# Patient Record
Sex: Male | Born: 1980 | Race: White | Hispanic: No | Marital: Married | Smoking: Current every day smoker
Health system: Southern US, Community
[De-identification: ages and names within clinical notes are randomized; demographics above are authoritative.]

## PROBLEM LIST (undated history)

## (undated) DIAGNOSIS — L02419 Cutaneous abscess of limb, unspecified: Secondary | ICD-10-CM

## (undated) DIAGNOSIS — B1920 Unspecified viral hepatitis C without hepatic coma: Secondary | ICD-10-CM

## (undated) HISTORY — PX: TONSILLECTOMY: SUR1361

---

## 2017-05-23 ENCOUNTER — Emergency Department (HOSPITAL_COMMUNITY)
Admission: EM | Admit: 2017-05-23 | Discharge: 2017-05-23 | Disposition: A | Discharge status: Home / Self Care | Payer: Self-pay | Attending: Emergency Medicine | Admitting: Emergency Medicine

## 2017-05-23 ENCOUNTER — Encounter (HOSPITAL_COMMUNITY): Payer: Self-pay

## 2017-05-23 ENCOUNTER — Other Ambulatory Visit: Payer: Self-pay

## 2017-05-23 ENCOUNTER — Emergency Department (HOSPITAL_COMMUNITY): Payer: Self-pay

## 2017-05-23 DIAGNOSIS — F17210 Nicotine dependence, cigarettes, uncomplicated: Secondary | ICD-10-CM | POA: Insufficient documentation

## 2017-05-23 DIAGNOSIS — L03113 Cellulitis of right upper limb: Secondary | ICD-10-CM | POA: Insufficient documentation

## 2017-05-23 HISTORY — DX: Unspecified viral hepatitis C without hepatic coma: B19.20

## 2017-05-23 LAB — BASIC METABOLIC PANEL
Anion gap: 8 (ref 5–15)
BUN: 15 mg/dL (ref 6–20)
CO2: 24 mmol/L (ref 22–32)
CREATININE: 0.8 mg/dL (ref 0.61–1.24)
Calcium: 8.8 mg/dL — ABNORMAL LOW (ref 8.9–10.3)
Chloride: 102 mmol/L (ref 101–111)
GFR calc non Af Amer: >60 mL/min (ref >60)
Glucose, Bld: 105 mg/dL — ABNORMAL HIGH (ref 65–99)
Potassium: 4.2 mmol/L (ref 3.5–5.1)
Sodium: 134 mmol/L — ABNORMAL LOW (ref 135–145)

## 2017-05-23 LAB — CBC WITH DIFFERENTIAL/PLATELET
BASOS PCT: 0 %
Basophils Absolute: 0 10*3/uL (ref 0.0–0.1)
EOS ABS: 0.3 10*3/uL (ref 0.0–0.7)
Eosinophils Relative: 2 %
HEMATOCRIT: 45.6 % (ref 39.0–52.0)
Hemoglobin: 15.7 g/dL (ref 13.0–17.0)
Lymphocytes Relative: 17 %
Lymphs Abs: 2.6 10*3/uL (ref 0.7–4.0)
MCH: 29.6 pg (ref 26.0–34.0)
MCHC: 34.4 g/dL (ref 30.0–36.0)
MCV: 86 fL (ref 78.0–100.0)
MONO ABS: 1 10*3/uL (ref 0.1–1.0)
MONOS PCT: 6 %
NEUTROS ABS: 11.5 10*3/uL — AB (ref 1.7–7.7)
Neutrophils Relative %: 75 %
Platelets: 251 10*3/uL (ref 150–400)
RBC: 5.3 MIL/uL (ref 4.22–5.81)
RDW: 14.5 % (ref 11.5–15.5)
WBC: 15.4 10*3/uL — ABNORMAL HIGH (ref 4.0–10.5)

## 2017-05-23 LAB — I-STAT CG4 LACTIC ACID, ED: Lactic Acid, Venous: 0.9 mmol/L (ref 0.5–1.9)

## 2017-05-23 MED ORDER — CEFTRIAXONE SODIUM 2 G IJ SOLR: 2.0000 g | Freq: Once | INTRAMUSCULAR | Status: DC

## 2017-05-23 MED ORDER — VANCOMYCIN HCL IN DEXTROSE 1-5 GM/200ML-% IV SOLN
1000.0000 mg | Freq: Once | INTRAVENOUS | Status: AC
  Administered 2017-05-23: 1000 mg via INTRAVENOUS
  Filled 2017-05-23: qty 200

## 2017-05-23 MED ORDER — CLINDAMYCIN HCL 150 MG PO CAPS: 450.0000 mg | ORAL_CAPSULE | Freq: Four times a day (QID) | ORAL | 0 refills | Status: AC

## 2017-05-23 MED ORDER — STERILE WATER FOR INJECTION IJ SOLN
INTRAMUSCULAR | Status: AC
  Administered 2017-05-23: 2.1 mL
  Filled 2017-05-23: qty 10

## 2017-05-23 MED ORDER — CEFTRIAXONE SODIUM 1 G IJ SOLR
1.0000 g | Freq: Once | INTRAMUSCULAR | Status: AC
  Administered 2017-05-23: 1 g via INTRAMUSCULAR
  Filled 2017-05-23: qty 10

## 2017-05-23 NOTE — ED Triage Notes (Signed)
Per Pt, Pt is coming from home with complaints of abscess noted to the right inner arm. Hx of the same x2. No redness at the site noted.

## 2017-05-23 NOTE — Discharge Instructions (Signed)
You presented with swelling, warmth and pain to right elbow. We are concerned for a deeper space abscess or infection that may be involving the joint. You declined further workup, CT scan and admission today and requested discharge. Please take antibiotics as prescribed. Take ibuprofen and Tylenol for pain and swelling. Return to the ED if you develop fevers, chills, worsening symptoms, inability to move the arm.

## 2017-05-23 NOTE — ED Provider Notes (Signed)
[facility]   CSN: [id number] Arrival date & time: 05/23/17  1244     History   Chief Complaint Chief Complaint  Patient presents with  . Abscess    HPI Brett [name] is a 36 y.o. male with history of IV drug use, multiple abscesses, osteomyelitis presents to ED for evaluation of gradually worsening swelling, redness, warmth and tenderness to right elbow 3 days. States he recently relapsed and injected cocaine through his right arm 3 days ago. Has been taking Excedrin and penicillin at home. Unsure about fevers but reports chills. Pain is worse with outpatient and movement. States he cannot extend his arm due to pain. No numbness or tingling distally. States this feels like his previous abscess but pain is worse and swelling happened faster.  HPI  Past Medical History:  Diagnosis Date  . Hepatitis C     There are no active problems to display for this patient.   Past Surgical History:  Procedure Laterality Date  . TONSILLECTOMY         Home Medications    Prior to Admission medications   Medication Sig Start Date End Date Taking? Authorizing Provider  clindamycin (CLEOCIN) 150 MG capsule Take 3 capsules (450 mg total) by mouth every 6 (six) hours for 7 days. 05/23/17 05/30/17  Liberty HandyGibbons, Claudia J, PA-C    Family History No family history on file.  Social History Social History   Tobacco Use  . Smoking status: Current Every Day Smoker    Packs/day: 0.50    Types: Cigarettes  . Smokeless tobacco: Never Used  Substance Use Topics  . Alcohol use: No    Frequency: Never  . Drug use: Yes    Types: Cocaine, Marijuana     Allergies   Patient has no known allergies.   Review of Systems Review of Systems  Constitutional: Positive for chills.  Musculoskeletal: Positive for arthralgias.  Skin: Positive for color change.     Physical Exam Updated Vital Signs BP 127/77 (BP Location: Left Arm)   Pulse  78   Temp 98.2 F (36.8 C) (Oral)   Resp 18   Ht 6\' 1"  (1.854 m)   Wt 93 kg (205 lb)   SpO2 92%   BMI 27.05 kg/m   Physical Exam  Constitutional: He is oriented to person, place, and time. He appears well-developed and well-nourished. No distress.  NAD. Non toxic.   HENT:  Head: Normocephalic and atraumatic.  Right Ear: External ear normal.  Left Ear: External ear normal.  Nose: Nose normal.  Eyes: Conjunctivae and EOM are normal. No scleral icterus.  Neck: Normal range of motion. Neck supple.  Cardiovascular: Normal rate, regular rhythm, normal heart sounds and intact distal pulses.  No murmur heard. Pulmonary/Chest: Effort normal and breath sounds normal. He has no wheezes.  Musculoskeletal: Normal range of motion. He exhibits edema and tenderness. He exhibits no deformity.  Focal area of induration, erythema, edema and warmth to left medial elbow measuring approximately 5 cm x 6 cm Mild pain with passive range of motion, decreased extension secondary to pain  Neurological: He is alert and oriented to person, place, and time.  Skin: Skin is warm and dry. Capillary refill takes less than 2 seconds.  See above. Multiple scars to bilateral upper extremities  Psychiatric: He has a normal mood and affect. His behavior is normal. Judgment and thought content normal.  Nursing note and vitals reviewed.    ED Treatments /  Results  Labs (all labs ordered are listed, but only abnormal results are displayed) Labs Reviewed  CBC WITH DIFFERENTIAL/PLATELET - Abnormal; Notable for the following components:      Result Value   WBC 15.4 (*)    Neutro Abs 11.5 (*)    All other components within normal limits  BASIC METABOLIC PANEL - Abnormal; Notable for the following components:   Sodium 134 (*)    Glucose, Bld 105 (*)    Calcium 8.8 (*)    All other components within normal limits  CULTURE, BLOOD (ROUTINE X 2)  CULTURE, BLOOD (ROUTINE X 2)  I-STAT CG4 LACTIC ACID, ED  I-STAT CG4  LACTIC ACID, ED    EKG  EKG Interpretation None       Radiology Dg Elbow Complete Right  Result Date: 05/23/2017 CLINICAL DATA:  Right arm pain, swelling, and warmth with concern for abscess. History of IV drug use. EXAM: RIGHT ELBOW - COMPLETE 3+ VIEW COMPARISON:  None. FINDINGS: There is no evidence of acute fracture, dislocation, or elbow joint effusion. Subcutaneous fat reticulation is present at the level of the antecubital fossa extending into the upper arm and forearm. No radiopaque foreign body is seen. IMPRESSION: Cellulitic changes without evidence of acute osseous abnormality or radiopaque foreign body. Electronically Signed   By: Sebastian AcheAllen  Grady M.D.   On: 05/23/2017 14:10    Procedures Procedures (including critical care time)  Medications Ordered in ED Medications  cefTRIAXone (ROCEPHIN) 2 g in dextrose 5 % 50 mL IVPB (not administered)  cefTRIAXone (ROCEPHIN) injection 1 g (not administered)  vancomycin (VANCOCIN) IVPB 1000 mg/200 mL premix (0 mg Intravenous Stopped 05/23/17 1715)     Initial Impression / Assessment and Plan / ED Course  I have reviewed the triage vital signs and the nursing notes.  Pertinent labs & imaging results that were available during my care of the patient were reviewed by me and considered in my medical decision making (see chart for details).  Clinical Course as of May 24 1735  Sun May 23, 2017  1622 WBC: (!) 15.4 [CG]  1625 Asked RN regarding blood cultures. These were not obtained prior to IV antibiotics. More blood needs to be drawn.   [CG]    Clinical Course User Index [CG] Liberty HandyGibbons, Claudia J, PA-C   36 year old with history of IV drug use presents with focal area of induration to the right elbow 3 days. He injected into this area 3-4 days ago.  On exam, he is afebrile. He has moderate are of induration to elbow with mild pain with passive range of motion. Extremities neurovascularly intact. No obvious fluctuance or superficial  abscess seen on ultrasound.  Labwork remarkable for leukocytosis WBC is 15.4. Lactic acid is normal. X-ray shows cellulitic changes from medial elbow extending to upper and lower arm. Will give antibiotics and obtain CT scan.  Final Clinical Impressions(s) / ED Diagnoses   IV infiltrated mid antibiotics. Awaiting IV team. Pending abx and CT scan. Given high risk, previous h/o, subjective chills I anticipate admission for iv antibiotics.   1730: Pt declining admission, CT or any other tx today. States he is worried about money and has to go to work [employer]. Discussed risks of leaving AMA. He requested d/c. Will give CTX IM and discharge with abx. Strict ED return precautions given.   Final diagnoses:  Cellulitis of right upper extremity    ED Discharge Orders        Ordered    clindamycin (CLEOCIN)  150 MG capsule  Every 6 hours     05/23/17 1733        Jerrell Mylar 05/23/17 1736    Loren Racer, MD 05/24/17 [phone]

## 2018-01-10 ENCOUNTER — Emergency Department (HOSPITAL_COMMUNITY): Admission: EM | Admit: 2018-01-10 | Discharge: 2018-01-10 | Discharge status: Against Medical Advice | Payer: Self-pay

## 2018-01-10 ENCOUNTER — Encounter (HOSPITAL_COMMUNITY): Payer: Self-pay | Admitting: *Deleted

## 2018-01-10 ENCOUNTER — Emergency Department (HOSPITAL_COMMUNITY)
Admission: EM | Admit: 2018-01-10 | Discharge: 2018-01-10 | Disposition: A | Discharge status: Home / Self Care | Payer: Self-pay | Attending: Emergency Medicine | Admitting: Emergency Medicine

## 2018-01-10 ENCOUNTER — Other Ambulatory Visit: Payer: Self-pay

## 2018-01-10 DIAGNOSIS — Z5321 Procedure and treatment not carried out due to patient leaving prior to being seen by health care provider: Secondary | ICD-10-CM | POA: Insufficient documentation

## 2018-01-10 DIAGNOSIS — K0889 Other specified disorders of teeth and supporting structures: Secondary | ICD-10-CM | POA: Insufficient documentation

## 2018-01-10 DIAGNOSIS — R339 Retention of urine, unspecified: Secondary | ICD-10-CM | POA: Insufficient documentation

## 2018-01-10 LAB — URINALYSIS, ROUTINE W REFLEX MICROSCOPIC
Bilirubin Urine: NEGATIVE
GLUCOSE, UA: NEGATIVE mg/dL
HGB URINE DIPSTICK: NEGATIVE
Ketones, ur: NEGATIVE mg/dL
LEUKOCYTES UA: NEGATIVE
Nitrite: NEGATIVE
PROTEIN: NEGATIVE mg/dL
SPECIFIC GRAVITY, URINE: 1.025 (ref 1.005–1.030)
pH: 5 (ref 5.0–8.0)

## 2018-01-10 NOTE — ED Notes (Signed)
Per front desk lobby staff, the pt went to [facility]

## 2018-01-10 NOTE — ED Triage Notes (Signed)
C/o dental pain lower rt molar. Also feels he can not urinate and empty bladder, states he has to push had and still does not feel as if empty. Pt states later has been going on for about a year

## 2018-01-10 NOTE — ED Notes (Signed)
No answer for triage.

## 2018-01-10 NOTE — ED Notes (Signed)
Bed: WTR6 Expected date:  Expected time:  Means of arrival:  Comments: 

## 2018-01-10 NOTE — ED Notes (Addendum)
No answer for triage.

## 2018-01-10 NOTE — ED Notes (Signed)
Brett [name] with registration stated pt walked up and threw labels at her and left.

## 2018-01-11 NOTE — ED Notes (Signed)
Follow up call made  No answer  01/11/18  1146  s Shalayne Leach rn

## 2018-01-12 ENCOUNTER — Other Ambulatory Visit: Payer: Self-pay

## 2018-01-12 ENCOUNTER — Emergency Department (HOSPITAL_COMMUNITY)
Admission: EM | Admit: 2018-01-12 | Discharge: 2018-01-12 | Discharge status: Against Medical Advice | Payer: Self-pay | Attending: Emergency Medicine | Admitting: Emergency Medicine

## 2018-01-12 ENCOUNTER — Encounter (HOSPITAL_COMMUNITY): Payer: Self-pay | Admitting: Emergency Medicine

## 2018-01-12 DIAGNOSIS — Z5321 Procedure and treatment not carried out due to patient leaving prior to being seen by health care provider: Secondary | ICD-10-CM | POA: Insufficient documentation

## 2018-01-12 DIAGNOSIS — K0889 Other specified disorders of teeth and supporting structures: Secondary | ICD-10-CM | POA: Insufficient documentation

## 2018-01-12 NOTE — ED Triage Notes (Signed)
Pt reports R lower dental pain, pt has came twice 2 days ago but LWBS both times.

## 2018-01-12 NOTE — ED Notes (Signed)
Called for room X2 with no answer 

## 2018-01-12 NOTE — ED Notes (Signed)
Called for room X3 with no answer 

## 2018-03-02 ENCOUNTER — Encounter (HOSPITAL_COMMUNITY): Payer: Self-pay | Admitting: Emergency Medicine

## 2018-03-02 ENCOUNTER — Emergency Department (HOSPITAL_COMMUNITY)
Admission: EM | Admit: 2018-03-02 | Discharge: 2018-03-02 | Discharge status: Against Medical Advice | Payer: Self-pay | Attending: Emergency Medicine | Admitting: Emergency Medicine

## 2018-03-02 ENCOUNTER — Inpatient Hospital Stay (HOSPITAL_COMMUNITY)
Admission: EM | Admit: 2018-03-02 | Discharge: 2018-03-03 | DRG: 854 | Disposition: A | Discharge status: Home / Self Care | Payer: Self-pay | Attending: Internal Medicine | Admitting: Internal Medicine

## 2018-03-02 ENCOUNTER — Other Ambulatory Visit: Payer: Self-pay

## 2018-03-02 ENCOUNTER — Emergency Department (HOSPITAL_COMMUNITY): Payer: Self-pay

## 2018-03-02 ENCOUNTER — Emergency Department (HOSPITAL_COMMUNITY): Payer: Self-pay | Admitting: Certified Registered"

## 2018-03-02 ENCOUNTER — Encounter (HOSPITAL_COMMUNITY)
Admission: EM | Disposition: A | Discharge status: Home / Self Care | Payer: Self-pay | Source: Home / Self Care | Attending: Internal Medicine

## 2018-03-02 DIAGNOSIS — L0390 Cellulitis, unspecified: Secondary | ICD-10-CM | POA: Diagnosis present

## 2018-03-02 DIAGNOSIS — F17210 Nicotine dependence, cigarettes, uncomplicated: Secondary | ICD-10-CM | POA: Diagnosis present

## 2018-03-02 DIAGNOSIS — R2232 Localized swelling, mass and lump, left upper limb: Secondary | ICD-10-CM | POA: Insufficient documentation

## 2018-03-02 DIAGNOSIS — L02519 Cutaneous abscess of unspecified hand: Secondary | ICD-10-CM

## 2018-03-02 DIAGNOSIS — Z5321 Procedure and treatment not carried out due to patient leaving prior to being seen by health care provider: Secondary | ICD-10-CM | POA: Insufficient documentation

## 2018-03-02 DIAGNOSIS — A419 Sepsis, unspecified organism: Principal | ICD-10-CM | POA: Diagnosis present

## 2018-03-02 DIAGNOSIS — F1910 Other psychoactive substance abuse, uncomplicated: Secondary | ICD-10-CM | POA: Diagnosis present

## 2018-03-02 DIAGNOSIS — M659 Synovitis and tenosynovitis, unspecified: Secondary | ICD-10-CM | POA: Diagnosis present

## 2018-03-02 DIAGNOSIS — Z880 Allergy status to penicillin: Secondary | ICD-10-CM

## 2018-03-02 DIAGNOSIS — L03114 Cellulitis of left upper limb: Secondary | ICD-10-CM | POA: Diagnosis present

## 2018-03-02 DIAGNOSIS — Z66 Do not resuscitate: Secondary | ICD-10-CM | POA: Diagnosis present

## 2018-03-02 DIAGNOSIS — F1990 Other psychoactive substance use, unspecified, uncomplicated: Secondary | ICD-10-CM | POA: Diagnosis present

## 2018-03-02 DIAGNOSIS — K4090 Unilateral inguinal hernia, without obstruction or gangrene, not specified as recurrent: Secondary | ICD-10-CM | POA: Diagnosis present

## 2018-03-02 DIAGNOSIS — L089 Local infection of the skin and subcutaneous tissue, unspecified: Secondary | ICD-10-CM

## 2018-03-02 HISTORY — PX: I & D EXTREMITY: SHX5045

## 2018-03-02 LAB — CBC WITH DIFFERENTIAL/PLATELET
Abs Immature Granulocytes: 0.1 10*3/uL (ref 0.0–0.1)
BASOS ABS: 0.1 10*3/uL (ref 0.0–0.1)
BASOS PCT: 1 %
EOS PCT: 2 %
Eosinophils Absolute: 0.2 10*3/uL (ref 0.0–0.7)
HCT: 45.4 % (ref 39.0–52.0)
HEMOGLOBIN: 14.9 g/dL (ref 13.0–17.0)
Immature Granulocytes: 1 %
LYMPHS PCT: 22 %
Lymphs Abs: 2.4 10*3/uL (ref 0.7–4.0)
MCH: 27.6 pg (ref 26.0–34.0)
MCHC: 32.8 g/dL (ref 30.0–36.0)
MCV: 84.1 fL (ref 78.0–100.0)
Monocytes Absolute: 1.3 10*3/uL — ABNORMAL HIGH (ref 0.1–1.0)
Monocytes Relative: 12 %
Neutro Abs: 6.7 10*3/uL (ref 1.7–7.7)
Neutrophils Relative %: 62 %
PLATELETS: 284 10*3/uL (ref 150–400)
RBC: 5.4 MIL/uL (ref 4.22–5.81)
RDW: 13.6 % (ref 11.5–15.5)
WBC: 10.7 10*3/uL — AB (ref 4.0–10.5)

## 2018-03-02 LAB — URINALYSIS, ROUTINE W REFLEX MICROSCOPIC
BILIRUBIN URINE: NEGATIVE
Bacteria, UA: NONE SEEN
Bilirubin Urine: NEGATIVE
GLUCOSE, UA: NEGATIVE mg/dL
GLUCOSE, UA: NEGATIVE mg/dL
HGB URINE DIPSTICK: NEGATIVE
HGB URINE DIPSTICK: NEGATIVE
Ketones, ur: NEGATIVE mg/dL
Ketones, ur: NEGATIVE mg/dL
LEUKOCYTES UA: NEGATIVE
Leukocytes, UA: NEGATIVE
NITRITE: NEGATIVE
Nitrite: NEGATIVE
PH: 9 — AB (ref 5.0–8.0)
PROTEIN: 30 mg/dL — AB
Protein, ur: 30 mg/dL — AB
SPECIFIC GRAVITY, URINE: 1.019 (ref 1.005–1.030)
Specific Gravity, Urine: 1.029 (ref 1.005–1.030)
pH: 5 (ref 5.0–8.0)

## 2018-03-02 LAB — I-STAT CG4 LACTIC ACID, ED
LACTIC ACID, VENOUS: 1.56 mmol/L (ref 0.5–1.9)
Lactic Acid, Venous: 1.15 mmol/L (ref 0.5–1.9)

## 2018-03-02 LAB — COMPREHENSIVE METABOLIC PANEL
ALT: 83 U/L — ABNORMAL HIGH (ref 0–44)
ANION GAP: 13 (ref 5–15)
AST: 36 U/L (ref 15–41)
Albumin: 3.6 g/dL (ref 3.5–5.0)
Alkaline Phosphatase: 188 U/L — ABNORMAL HIGH (ref 38–126)
BILIRUBIN TOTAL: 0.7 mg/dL (ref 0.3–1.2)
BUN: 15 mg/dL (ref 6–20)
CO2: 33 mmol/L — ABNORMAL HIGH (ref 22–32)
Calcium: 9.2 mg/dL (ref 8.9–10.3)
Chloride: 92 mmol/L — ABNORMAL LOW (ref 98–111)
Creatinine, Ser: 0.95 mg/dL (ref 0.61–1.24)
GFR calc Af Amer: >60 mL/min (ref >60)
Glucose, Bld: 134 mg/dL — ABNORMAL HIGH (ref 70–99)
POTASSIUM: 3.9 mmol/L (ref 3.5–5.1)
Sodium: 138 mmol/L (ref 135–145)
TOTAL PROTEIN: 7.4 g/dL (ref 6.5–8.1)

## 2018-03-02 SURGERY — IRRIGATION AND DEBRIDEMENT EXTREMITY
Anesthesia: General | Site: Hand | Laterality: Left

## 2018-03-02 MED ORDER — SODIUM CHLORIDE 0.9 % IV SOLN
INTRAVENOUS | Status: DC | PRN
  Administered 2018-03-02: 19:00:00 via INTRAVENOUS

## 2018-03-02 MED ORDER — ACETAMINOPHEN 325 MG PO TABS: 650.0000 mg | ORAL_TABLET | Freq: Four times a day (QID) | ORAL | Status: DC | PRN

## 2018-03-02 MED ORDER — PROPOFOL 10 MG/ML IV BOLUS
INTRAVENOUS | Status: AC
  Filled 2018-03-02: qty 20

## 2018-03-02 MED ORDER — FENTANYL CITRATE (PF) 100 MCG/2ML IJ SOLN: 25.0000 ug | INTRAMUSCULAR | Status: DC | PRN

## 2018-03-02 MED ORDER — ONDANSETRON HCL 4 MG/2ML IJ SOLN: 4.0000 mg | Freq: Four times a day (QID) | INTRAMUSCULAR | Status: DC | PRN

## 2018-03-02 MED ORDER — LIDOCAINE 2% (20 MG/ML) 5 ML SYRINGE
INTRAMUSCULAR | Status: AC
  Filled 2018-03-02: qty 5

## 2018-03-02 MED ORDER — SODIUM CHLORIDE 0.9 % IV BOLUS (SEPSIS)
1000.0000 mL | Freq: Once | INTRAVENOUS | Status: AC
  Administered 2018-03-02: 1000 mL via INTRAVENOUS

## 2018-03-02 MED ORDER — CHLORHEXIDINE GLUCONATE 4 % EX LIQD
60.0000 mL | Freq: Once | CUTANEOUS | Status: DC
  Filled 2018-03-02: qty 60

## 2018-03-02 MED ORDER — MIDAZOLAM HCL 2 MG/2ML IJ SOLN
INTRAMUSCULAR | Status: AC
  Filled 2018-03-02: qty 2

## 2018-03-02 MED ORDER — OXYCODONE HCL 5 MG/5ML PO SOLN: 5.0000 mg | Freq: Once | ORAL | Status: DC | PRN

## 2018-03-02 MED ORDER — SUFENTANIL CITRATE 50 MCG/ML IV SOLN
INTRAVENOUS | Status: AC
  Filled 2018-03-02: qty 1

## 2018-03-02 MED ORDER — ONDANSETRON HCL 4 MG/2ML IJ SOLN
INTRAMUSCULAR | Status: AC
  Filled 2018-03-02: qty 2

## 2018-03-02 MED ORDER — PROPOFOL 10 MG/ML IV BOLUS
INTRAVENOUS | Status: DC | PRN
  Administered 2018-03-02: 200 mg via INTRAVENOUS
  Administered 2018-03-02: 50 mg via INTRAVENOUS

## 2018-03-02 MED ORDER — SODIUM CHLORIDE 0.9 % IJ SOLN
INTRAMUSCULAR | Status: AC
  Filled 2018-03-02: qty 10

## 2018-03-02 MED ORDER — SODIUM CHLORIDE 0.9 % IV SOLN
2.0000 g | Freq: Once | INTRAVENOUS | Status: AC
  Administered 2018-03-02: 2 g via INTRAVENOUS
  Filled 2018-03-02: qty 20

## 2018-03-02 MED ORDER — TRAMADOL HCL 50 MG PO TABS
50.0000 mg | ORAL_TABLET | Freq: Four times a day (QID) | ORAL | Status: DC | PRN
  Filled 2018-03-02: qty 1

## 2018-03-02 MED ORDER — SODIUM CHLORIDE 0.9 % IV BOLUS (SEPSIS)
500.0000 mL | Freq: Once | INTRAVENOUS | Status: AC
  Administered 2018-03-02: 500 mL via INTRAVENOUS

## 2018-03-02 MED ORDER — NICOTINE 21 MG/24HR TD PT24
21.0000 mg | MEDICATED_PATCH | Freq: Every day | TRANSDERMAL | Status: DC
  Administered 2018-03-03: 21 mg via TRANSDERMAL
  Filled 2018-03-02: qty 1

## 2018-03-02 MED ORDER — SODIUM CHLORIDE 0.9 % IV SOLN
INTRAVENOUS | Status: AC
  Administered 2018-03-03 (×2): via INTRAVENOUS

## 2018-03-02 MED ORDER — SODIUM CHLORIDE 0.9 % IR SOLN
Status: DC | PRN
  Administered 2018-03-02: 1000 mL

## 2018-03-02 MED ORDER — SODIUM CHLORIDE 0.9 % IR SOLN
Status: DC | PRN
  Administered 2018-03-02: 3000 mL

## 2018-03-02 MED ORDER — CLINDAMYCIN PHOSPHATE 900 MG/50ML IV SOLN
900.0000 mg | INTRAVENOUS | Status: DC
  Filled 2018-03-02 (×3): qty 50

## 2018-03-02 MED ORDER — ONDANSETRON HCL 4 MG PO TABS: 4.0000 mg | ORAL_TABLET | Freq: Four times a day (QID) | ORAL | Status: DC | PRN

## 2018-03-02 MED ORDER — POVIDONE-IODINE 10 % EX SWAB: 2.0000 "application " | Freq: Once | CUTANEOUS | Status: DC

## 2018-03-02 MED ORDER — ONDANSETRON HCL 4 MG/2ML IJ SOLN: 4.0000 mg | Freq: Once | INTRAMUSCULAR | Status: DC | PRN

## 2018-03-02 MED ORDER — SUCCINYLCHOLINE CHLORIDE 200 MG/10ML IV SOSY
PREFILLED_SYRINGE | INTRAVENOUS | Status: AC
  Filled 2018-03-02: qty 10

## 2018-03-02 MED ORDER — SUFENTANIL CITRATE 50 MCG/ML IV SOLN
INTRAVENOUS | Status: DC | PRN
  Administered 2018-03-02 (×2): 10 ug via INTRAVENOUS

## 2018-03-02 MED ORDER — MIDAZOLAM HCL 2 MG/2ML IJ SOLN: 2.0000 mg | Freq: Once | INTRAMUSCULAR | Status: DC

## 2018-03-02 MED ORDER — LACTATED RINGERS IV SOLN: INTRAVENOUS | Status: DC | PRN

## 2018-03-02 MED ORDER — LIDOCAINE 2% (20 MG/ML) 5 ML SYRINGE
INTRAMUSCULAR | Status: DC | PRN
  Administered 2018-03-02: 100 mg via INTRAVENOUS

## 2018-03-02 MED ORDER — SUCCINYLCHOLINE CHLORIDE 200 MG/10ML IV SOSY
PREFILLED_SYRINGE | INTRAVENOUS | Status: DC | PRN
  Administered 2018-03-02: 140 mg via INTRAVENOUS

## 2018-03-02 MED ORDER — VANCOMYCIN HCL 10 G IV SOLR
1500.0000 mg | Freq: Once | INTRAVENOUS | Status: AC
  Administered 2018-03-02: 1500 mg via INTRAVENOUS
  Filled 2018-03-02: qty 1500

## 2018-03-02 MED ORDER — OXYCODONE HCL 5 MG PO TABS: 5.0000 mg | ORAL_TABLET | Freq: Once | ORAL | Status: DC | PRN

## 2018-03-02 MED ORDER — MIDAZOLAM HCL 5 MG/5ML IJ SOLN
INTRAMUSCULAR | Status: DC | PRN
  Administered 2018-03-02: 2 mg via INTRAVENOUS

## 2018-03-02 MED ORDER — NICOTINE POLACRILEX 2 MG MT GUM
2.0000 mg | CHEWING_GUM | OROMUCOSAL | Status: DC | PRN
  Administered 2018-03-03: 2 mg via ORAL
  Filled 2018-03-02 (×3): qty 1

## 2018-03-02 MED ORDER — ACETAMINOPHEN 650 MG RE SUPP: 650.0000 mg | Freq: Four times a day (QID) | RECTAL | Status: DC | PRN

## 2018-03-02 MED ORDER — VANCOMYCIN HCL IN DEXTROSE 1-5 GM/200ML-% IV SOLN
1000.0000 mg | Freq: Three times a day (TID) | INTRAVENOUS | Status: DC
  Administered 2018-03-03 (×2): 1000 mg via INTRAVENOUS
  Filled 2018-03-02 (×4): qty 200

## 2018-03-02 SURGICAL SUPPLY — 58 items
BANDAGE ACE 3X5.8 VEL STRL LF (GAUZE/BANDAGES/DRESSINGS) ×3 IMPLANT
BANDAGE ACE 4X5 VEL STRL LF (GAUZE/BANDAGES/DRESSINGS) ×3 IMPLANT
BANDAGE ELASTIC 3 VELCRO ST LF (GAUZE/BANDAGES/DRESSINGS) ×3 IMPLANT
BANDAGE ELASTIC 4 VELCRO ST LF (GAUZE/BANDAGES/DRESSINGS) ×3 IMPLANT
BNDG COHESIVE 1X5 TAN STRL LF (GAUZE/BANDAGES/DRESSINGS) IMPLANT
BNDG CONFORM 2 STRL LF (GAUZE/BANDAGES/DRESSINGS) IMPLANT
BNDG ESMARK 4X9 LF (GAUZE/BANDAGES/DRESSINGS) ×3 IMPLANT
BNDG GAUZE ELAST 4 BULKY (GAUZE/BANDAGES/DRESSINGS) ×3 IMPLANT
CORDS BIPOLAR (ELECTRODE) ×3 IMPLANT
COVER SURGICAL LIGHT HANDLE (MISCELLANEOUS) ×3 IMPLANT
CUFF TOURNIQUET SINGLE 18IN (TOURNIQUET CUFF) ×3 IMPLANT
CUFF TOURNIQUET SINGLE 24IN (TOURNIQUET CUFF) IMPLANT
DRAIN PENROSE 1/4X12 LTX STRL (WOUND CARE) IMPLANT
DRAPE SURG 17X23 STRL (DRAPES) ×3 IMPLANT
DRSG ADAPTIC 3X8 NADH LF (GAUZE/BANDAGES/DRESSINGS) ×3 IMPLANT
ELECT REM PT RETURN 9FT ADLT (ELECTROSURGICAL)
ELECTRODE REM PT RTRN 9FT ADLT (ELECTROSURGICAL) IMPLANT
GAUZE SPONGE 4X4 12PLY STRL (GAUZE/BANDAGES/DRESSINGS) ×3 IMPLANT
GAUZE XEROFORM 1X8 LF (GAUZE/BANDAGES/DRESSINGS) ×3 IMPLANT
GAUZE XEROFORM 5X9 LF (GAUZE/BANDAGES/DRESSINGS) IMPLANT
GLOVE BIOGEL PI IND STRL 8.5 (GLOVE) ×1 IMPLANT
GLOVE BIOGEL PI INDICATOR 8.5 (GLOVE) ×2
GLOVE SURG ORTHO 8.0 STRL STRW (GLOVE) ×3 IMPLANT
GOWN STRL REUS W/ TWL LRG LVL3 (GOWN DISPOSABLE) ×3 IMPLANT
GOWN STRL REUS W/ TWL XL LVL3 (GOWN DISPOSABLE) ×1 IMPLANT
GOWN STRL REUS W/TWL LRG LVL3 (GOWN DISPOSABLE) ×6
GOWN STRL REUS W/TWL XL LVL3 (GOWN DISPOSABLE) ×2
HANDPIECE INTERPULSE COAX TIP (DISPOSABLE)
KIT BASIN OR (CUSTOM PROCEDURE TRAY) ×3 IMPLANT
KIT TURNOVER KIT B (KITS) ×3 IMPLANT
MANIFOLD NEPTUNE II (INSTRUMENTS) ×3 IMPLANT
NEEDLE HYPO 25GX1X1/2 BEV (NEEDLE) IMPLANT
NS IRRIG 1000ML POUR BTL (IV SOLUTION) ×3 IMPLANT
PACK ORTHO EXTREMITY (CUSTOM PROCEDURE TRAY) ×3 IMPLANT
PAD ARMBOARD 7.5X6 YLW CONV (MISCELLANEOUS) ×6 IMPLANT
PAD CAST 4YDX4 CTTN HI CHSV (CAST SUPPLIES) ×1 IMPLANT
PADDING CAST COTTON 4X4 STRL (CAST SUPPLIES) ×2
SET CYSTO W/LG BORE CLAMP LF (SET/KITS/TRAYS/PACK) IMPLANT
SET HNDPC FAN SPRY TIP SCT (DISPOSABLE) IMPLANT
SOAP 2 % CHG 4 OZ (WOUND CARE) ×3 IMPLANT
SPLINT FIBERGLASS 3X35 (CAST SUPPLIES) ×3 IMPLANT
SPONGE LAP 18X18 X RAY DECT (DISPOSABLE) ×3 IMPLANT
SPONGE LAP 4X18 RFD (DISPOSABLE) ×3 IMPLANT
SUT ETHILON 4 0 PS 2 18 (SUTURE) IMPLANT
SUT ETHILON 5 0 P 3 18 (SUTURE)
SUT NYLON ETHILON 5-0 P-3 1X18 (SUTURE) IMPLANT
SUT PROLENE 3 0 PS 2 (SUTURE) ×3 IMPLANT
SUT PROLENE 3 0 SH DA (SUTURE) ×3 IMPLANT
SWAB COLLECTION DEVICE MRSA (MISCELLANEOUS) ×3 IMPLANT
SWAB CULTURE ESWAB REG 1ML (MISCELLANEOUS) IMPLANT
SYR CONTROL 10ML LL (SYRINGE) IMPLANT
TOWEL OR 17X24 6PK STRL BLUE (TOWEL DISPOSABLE) ×3 IMPLANT
TOWEL OR 17X26 10 PK STRL BLUE (TOWEL DISPOSABLE) ×3 IMPLANT
TUBE CONNECTING 12'X1/4 (SUCTIONS) ×1
TUBE CONNECTING 12X1/4 (SUCTIONS) ×2 IMPLANT
UNDERPAD 30X30 (UNDERPADS AND DIAPERS) ×3 IMPLANT
WATER STERILE IRR 1000ML POUR (IV SOLUTION) ×3 IMPLANT
YANKAUER SUCT BULB TIP NO VENT (SUCTIONS) ×3 IMPLANT

## 2018-03-02 NOTE — Consult Note (Addendum)
Reason for Consult:Left hand infection Referring Physician: [name] is an 37 y.o. male.  HPI: Brett [name] comes to the ED with a 4d hx/o left hand swelling and pain. He denies any antecedent event. It really got bad in the last 48h. He denies any fevers, chills, sweats, N/V. He is LHD. His only risk factor is current IVDU but he does not shoot up in that arm/hand.  Past Medical History:  Diagnosis Date  . Hepatitis C     Past Surgical History:  Procedure Laterality Date  . TONSILLECTOMY      History reviewed. No pertinent family history.  Social History:  reports that he has been smoking cigarettes. He has been smoking about 0.50 packs per day. He has never used smokeless tobacco. He reports that he has current or past drug history. Drugs: Cocaine and Marijuana. He reports that he does not drink alcohol.  Allergies:  Allergies  Allergen Reactions  . Penicillins     Medications: I have reviewed the patient's current medications.  Results for orders placed or performed during the hospital encounter of 03/02/18 (from the past 48 hour(s))  Urinalysis, Routine w reflex microscopic     Status: Abnormal   Collection Time: 03/02/18 12:27 PM  Result Value Ref Range   Color, Urine YELLOW YELLOW   APPearance CLOUDY (A) CLEAR   Specific Gravity, Urine 1.019 1.005 - 1.030   pH 9.0 (H) 5.0 - 8.0   Glucose, UA NEGATIVE NEGATIVE mg/dL   Hgb urine dipstick NEGATIVE NEGATIVE   Bilirubin Urine NEGATIVE NEGATIVE   Ketones, ur NEGATIVE NEGATIVE mg/dL   Protein, ur 30 (A) NEGATIVE mg/dL   Nitrite NEGATIVE NEGATIVE   Leukocytes, UA NEGATIVE NEGATIVE   WBC, UA 11-20 0 - 5 WBC/hpf   Bacteria, UA RARE (A) NONE SEEN   Squamous Epithelial / LPF 0-5 0 - 5   Amorphous Crystal PRESENT     Comment: Performed at [facility], [address] [place], [place] [address]  Comprehensive metabolic panel     Status: Abnormal   Collection Time: 03/02/18  1:01 PM  Result Value Ref Range    Sodium 138 135 - 145 mmol/L   Potassium 3.9 3.5 - 5.1 mmol/L   Chloride 92 (L) 98 - 111 mmol/L   CO2 33 (H) 22 - 32 mmol/L   Glucose, Bld 134 (H) 70 - 99 mg/dL   BUN 15 6 - 20 mg/dL   Creatinine, Ser 0.95 0.61 - 1.24 mg/dL   Calcium 9.2 8.9 - 10.3 mg/dL   Total Protein 7.4 6.5 - 8.1 g/dL   Albumin 3.6 3.5 - 5.0 g/dL   AST 36 15 - 41 U/L   ALT 83 (H) 0 - 44 U/L   Alkaline Phosphatase 188 (H) 38 - 126 U/L   Total Bilirubin 0.7 0.3 - 1.2 mg/dL   GFR calc non Af Amer >60 >60 mL/min   GFR calc Af Amer >60 >60 mL/min    Comment: (NOTE) The eGFR has been calculated using the CKD EPI equation. This calculation has not been validated in all clinical situations. eGFR's persistently <60 mL/min signify possible Chronic Kidney Disease.    Anion gap 13 5 - 15    Comment: Performed at [facility] [address], [place], [place] [address]  CBC with Differential     Status: Abnormal   Collection Time: 03/02/18  1:01 PM  Result Value Ref Range   WBC 10.7 (H) 4.0 -  10.5 K/uL   RBC 5.40 4.22 - 5.81 MIL/uL   Hemoglobin 14.9 13.0 - 17.0 g/dL   HCT 45.4 39.0 - 52.0 %   MCV 84.1 78.0 - 100.0 fL   MCH 27.6 26.0 - 34.0 pg   MCHC 32.8 30.0 - 36.0 g/dL   RDW 13.6 11.5 - 15.5 %   Platelets 284 150 - 400 K/uL   Neutrophils Relative % 62 %   Neutro Abs 6.7 1.7 - 7.7 K/uL   Lymphocytes Relative 22 %   Lymphs Abs 2.4 0.7 - 4.0 K/uL   Monocytes Relative 12 %   Monocytes Absolute 1.3 (H) 0.1 - 1.0 K/uL   Eosinophils Relative 2 %   Eosinophils Absolute 0.2 0.0 - 0.7 K/uL   Basophils Relative 1 %   Basophils Absolute 0.1 0.0 - 0.1 K/uL   Immature Granulocytes 1 %   Abs Immature Granulocytes 0.1 0.0 - 0.1 K/uL    Comment: Performed at [facility] [address] [place],  [address]  I-Stat CG4 Lactic Acid, ED     Status: None   Collection Time: 03/02/18  1:09 PM  Result Value Ref Range   Lactic Acid, Venous 1.15 0.5 - 1.9 mmol/L    No results found.  Review of Systems   Constitutional: Negative for weight loss.  HENT: Negative for ear discharge, ear pain, hearing loss and tinnitus.   Eyes: Negative for blurred vision, double vision, photophobia and pain.  Respiratory: Negative for cough, sputum production and shortness of breath.   Cardiovascular: Negative for chest pain.  Gastrointestinal: Negative for abdominal pain, nausea and vomiting.  Genitourinary: Negative for dysuria, flank pain, frequency and urgency.  Musculoskeletal: Positive for joint pain (Left hand). Negative for back pain, falls, myalgias and neck pain.  Neurological: Negative for dizziness, tingling, sensory change, focal weakness, loss of consciousness and headaches.  Endo/Heme/Allergies: Does not bruise/bleed easily.  Psychiatric/Behavioral: Negative for depression, memory loss and substance abuse. The patient is not nervous/anxious.    Blood pressure 133/90, pulse 98, temperature 98.6 F (37 C), temperature source Oral, resp. rate 18, SpO2 95 %. Physical Exam  Constitutional: He appears well-developed and well-nourished. No distress.  HENT:  Head: Normocephalic and atraumatic.  Eyes: Conjunctivae are normal. Right eye exhibits no discharge. Left eye exhibits no discharge. No scleral icterus.  Neck: Normal range of motion.  Cardiovascular: Normal rate and regular rhythm.  Respiratory: Effort normal. No respiratory distress.  Musculoskeletal:  Left shoulder, elbow, wrist, digits- no skin wounds, hand mod edema, mod TTP, pain with flex/ext of fingers but only at MCP joints and only at extremes of motion, no instability, no blocks to motion  Sens  Ax/R/M/U intact  Mot   Ax/ R/ PIN/ M/ AIN/ U intact  Rad 2+  Neurological: He is alert.  Skin: Skin is warm and dry. He is not diaphoretic.  Psychiatric: He has a normal mood and affect. His behavior is normal.    Assessment/Plan: Left hand infection -- His exam is reassuring that this has not invaded his tendon sheaths yet. Suggest  broad-spectrum abx and admission by IM. Hand surgery will follow to ensure he does not develop deep space infection needing OR I&D. He will be NPO after MN just in case his exam is worse in the AM. IVDU  GIVEN THE PATIENT'S HISTORY AND THE EXAMINATION TODAY IS MY RECOMMENDATION THAT THE PATIENT UNDERGO INCISION AND DRAINAGE OF THE DORSAL ASPECT OF THE LEFT HAND. sIGNED INFORMED CONSENT WAS OBTAINED.  wE'LL PROCEED WITH THE INTERVENTION TONIGHT. cONTINUE ON THE INTERNAL MEDICINE SERVICE FOR iv ANTIBIOTICS AND PAIN CONTROL.  Brockton Mckesson Caralyn Guile MD    Lisette Abu, PA-C Orthopedic Surgery [phone] 03/02/2018, 3:01 PM

## 2018-03-02 NOTE — ED Notes (Signed)
Called patient to room and no answer. 

## 2018-03-02 NOTE — H&P (Addendum)
Brett [name] LKG:[id number] DOB: 07/07/1980 DOA: 03/02/2018     PCP: Patient, No Pcp Per   Outpatient Specialists:  NONE    Patient arrived to ER on 03/02/18 at 1204  Patient coming from: home Lives alone,      Chief Complaint:  Chief Complaint  Patient presents with  . Hand Pain  . Abdominal Pain    HPI: Brett [name] is a 37 y.o. male with medical history significant of IV drug use, multiple abscesses, osteomyelitis, hepatitis C, tobacco abuse    Presented with   Left hand swelling for the past 4 days.  Getting worse for the past 2 days he presented today to emergency department first at 2 AM but left without being seen and came back again around noon.  Patient was noted to leave the emergency department and then returned.  Patient admits to using IV drugs but states he never injected in that hand.  Patient was also informing ER staff that he has left inguinal hernia which has been interfering his urination states today he had an episode of pain in his left scrotum but now resolved Associated chest pain or shortness of breath have been having subjective fevers Prior to this he has been seen in November 2018 for right elbow cellulitis  Regarding pertinent Chronic problems: History of hepatitis C untreated   While in ER: Initially noted to be tachycardic with a largely swollen left hand Ceftriaxone and Vanc Patient was taken from ER directly to or for I&D and joint irrigation of the left hand Wound cultures were obtained The following Work up has been ordered so far:  Orders Placed This Encounter  Procedures  . Procedural/ Surgical Case Request: IRRIGATION AND DEBRIDEMENT EXTREMITY  . Blood Culture (routine x 2)  . Aerobic/Anaerobic Culture (surgical/deep wound)  . DG Hand 2 View Left  . Comprehensive metabolic panel  . CBC with Differential  . Urinalysis, Routine w reflex microscopic  . Diet NPO time specified  . Diet NPO time specified  . Saline Lock IV, Maintain IV  access  . Cardiac monitoring  . Refer to Sidebar Report for: Sepsis Bundle ED/IP  . Document vital signs within 1-hour of fluid bolus completion and notify provider of bolus completion  . Document Actual / Estimated Weight  . Insert peripheral IV x 2  . Initiate Carrier Fluid Protocol  . Provider attestation of informed consent for procedure/surgical case  . Informed consent details: transcribe and obtain patient signature  . Initiate Pre-op Protocol  . Preoperative MRSA Screening  . Pre-admission testing diagnosis  . If diabetic or glucose greater than 140 notify MD to place Glycemic Control Order Set Contact Anesthesiologist  . Patient education (specify): Incentive Spirometry  . Void on call to OR  . Allergy Assessment  . Discharge  per PACU criteria  . Vital signs  . Cardiac monitoring  . Notify physician / Nurse Anesthetist  . May continue current IVF if bag empty and / or patient is nauseated and no further IV orders are written  . Discharge  per PACU criteria  . Vital signs  . Cardiac monitoring  . Notify physician / Nurse Anesthetist  . Call Code Sepsis [name] [phone]) Reason for Consult? tracking  . pharmacy consult  . Consult to hand surgery  . vancomycin per pharmacy consult  . Consult for [facility] Admission  . Pulse oximetry, continuous  . Pulse oximetry, continuous  . Oxygen therapy Mode or (Route): Nasal cannula; FiO2: OTHER SEE  COMMENTS; Liters Per Minute: 2; Keep 02 saturation: greater than or equal to 92 %  . Pulse oximetry, continuous  . Oxygen therapy Mode or (Route): Nasal cannula; FiO2: OTHER SEE COMMENTS; Liters Per Minute: 2; Keep 02 saturation: greater than or equal to 92 %  . I-Stat CG4 Lactic Acid, ED  . ED EKG 12-Lead  . EKG 12-Lead  . Insert and maintain  IV      Following Medications were ordered in ER: Medications  vancomycin (VANCOCIN) IVPB 1000 mg/200 mL premix ( Intravenous Automatically Held 03/10/18 2300)  chlorhexidine  (HIBICLENS) 4 % liquid 4 application (has no administration in time range)  povidone-iodine 10 % swab 2 application (has no administration in time range)  clindamycin (CLEOCIN) IVPB 900 mg (has no administration in time range)  fentaNYL (SUBLIMAZE) injection 25-50 mcg (has no administration in time range)  oxyCODONE (Oxy IR/ROXICODONE) immediate release tablet 5 mg (has no administration in time range)    Or  oxyCODONE (ROXICODONE) 5 MG/5ML solution 5 mg (has no administration in time range)  fentaNYL (SUBLIMAZE) injection 25-50 mcg (has no administration in time range)  ondansetron (ZOFRAN) injection 4 mg (has no administration in time range)  midazolam (VERSED) injection 2 mg (has no administration in time range)  sodium chloride 0.9 % bolus 1,000 mL (0 mLs Intravenous Stopped 03/02/18 1619)    And  sodium chloride 0.9 % bolus 1,000 mL (1,000 mLs Intravenous New Bag/Given 03/02/18 1637)    And  sodium chloride 0.9 % bolus 500 mL (500 mLs Intravenous New Bag/Given 03/02/18 1637)  vancomycin (VANCOCIN) 1,500 mg in sodium chloride 0.9 % 500 mL IVPB (1,500 mg Intravenous New Bag/Given 03/02/18 1637)  cefTRIAXone (ROCEPHIN) 2 g in sodium chloride 0.9 % 100 mL IVPB (0 g Intravenous Stopped 03/02/18 1619)    Significant initial  Findings: Abnormal Labs Reviewed  COMPREHENSIVE METABOLIC PANEL - Abnormal; Notable for the following components:      Result Value   Chloride 92 (*)    CO2 33 (*)    Glucose, Bld 134 (*)    ALT 83 (*)    Alkaline Phosphatase 188 (*)    All other components within normal limits  CBC WITH DIFFERENTIAL/PLATELET - Abnormal; Notable for the following components:   WBC 10.7 (*)    Monocytes Absolute 1.3 (*)    All other components within normal limits  URINALYSIS, ROUTINE W REFLEX MICROSCOPIC - Abnormal; Notable for the following components:   APPearance CLOUDY (*)    pH 9.0 (*)    Protein, ur 30 (*)    Bacteria, UA RARE (*)    All other components within normal limits    Lactic acid 1.56  Na 138 K 3.9  Cr stable,    Lab Results  Component Value Date   CREATININE 0.95 03/02/2018   CREATININE 0.80 05/23/2017      WBC  10.7  HG/HCT   stable,       Component Value Date/Time   HGB 14.9 03/02/2018 1301   HCT 45.4 03/02/2018 1301      Lactic Acid, Venous    Component Value Date/Time   LATICACIDVEN 1.56 03/02/2018 1509      UA  no evidence of UTI     Urine imaging showed marked soft swelling of the left hand no gas or osseous abnormality  ECG:  Personally reviewed by me showing: HR : 82 Rhythm:  NSR,    no evidence of ischemic changes QTC 441  ED Triage Vitals  Enc Vitals Group     BP 03/02/18 1220 124/70     Pulse Rate 03/02/18 1220 (!) 102     Resp 03/02/18 1220 18     Temp 03/02/18 1220 98.6 F (37 C)     Temp Source 03/02/18 1220 Oral     SpO2 03/02/18 1220 98 %     Weight --      Height --      Head Circumference --      Peak Flow --      Pain Score 03/02/18 1226 10     Pain Loc --      Pain Edu? --      Excl. in GC? --   TMAX(24)@       Latest  Blood pressure 132/72, pulse 87, temperature (!) 97.5 F (36.4 C), resp. rate 13, SpO2 96 %.    ER Provider Called:     Dr. Melvyn Novas They Recommend taken to the OR for I&D Medicine to admit would probably benefit from ID consult in a.m. Will see   in ER and continue to follow  Hospitalist was called for admission for left hand cellulitis   Review of Systems:    Pertinent positives include: Fevers, chills,  Constitutional:  No weight loss, night sweats,  fatigue, weight loss  HEENT:  No headaches, Difficulty swallowing,Tooth/dental problems,Sore throat,  No sneezing, itching, ear ache, nasal congestion, post nasal drip,  Cardio-vascular:  No chest pain, Orthopnea, PND, anasarca, dizziness, palpitations.no Bilateral lower extremity swelling  GI:  No heartburn, indigestion, abdominal pain, nausea, vomiting, diarrhea, change in bowel habits, loss of appetite,  melena, blood in stool, hematemesis Resp:  no shortness of breath at rest. No dyspnea on exertion, No excess mucus, no productive cough, No non-productive cough, No coughing up of blood.No change in color of mucus.No wheezing. Skin:  no rash or lesions. No jaundice GU:  no dysuria, change in color of urine, no urgency or frequency. No straining to urinate.  No flank pain.  Musculoskeletal:  No joint pain or no joint swelling. No decreased range of motion. No back pain.  Psych:  No change in mood or affect. No depression or anxiety. No memory loss.  Neuro: no localizing neurological complaints, no tingling, no weakness, no double vision, no gait abnormality, no slurred speech, no confusion  All systems reviewed and apart from HOPI all are negative  Past Medical History:   Past Medical History:  Diagnosis Date  . Hepatitis C       Past Surgical History:  Procedure Laterality Date  . TONSILLECTOMY      Social History:  Ambulatory  independently       reports that he has been smoking cigarettes. He has been smoking about 0.50 packs per day. He has never used smokeless tobacco. He reports that he has current or past drug history. Drugs: Cocaine and Marijuana. He reports that he does not drink alcohol.     Family History: patient states he does not know anything about his family  Allergies: Allergies  Allergen Reactions  . Penicillins Shortness Of Breath, Itching and Rash    Has patient had a PCN reaction causing immediate rash, facial/tongue/throat swelling, SOB or lightheadedness with hypotension: Yes Has patient had a PCN reaction causing severe rash involving mucus membranes or skin necrosis: Unk Has patient had a PCN reaction that required hospitalization: Unk Has patient had a PCN reaction occurring within the last 10 years: No If all  of the above answers are "NO", then may proceed with Cephalosporin use.      Prior to Admission medications   Not on File    Physical Exam: Blood pressure 132/72, pulse 87, temperature (!) 97.5 F (36.4 C), resp. rate 13, SpO2 96 %. 1. General:  in No Acute distress   Chronically ill  deshevelled -appearing 2. Psychological: Alert and   Oriented 3. Head/ENT:    Dry Mucous Membranes                          Head Non traumatic, neck supple                           Poor Dentition 4. SKIN:  decreased Skin turgor,  Skin clean Dry and intact no rash, Left arm in Dressing,   5. Heart: Regular rate and rhythm no  Murmur, no Rub or gallop 6. Lungs:   no wheezes or crackles   7. Abdomen: Soft,  non-tender, Non distended  bowel sounds present 8. Lower extremities: no clubbing, cyanosis, or  edema 9. Neurologically Grossly intact, moving all 4 extremities equally    LABS:     Recent Labs  Lab 03/02/18 1301  WBC 10.7*  NEUTROABS 6.7  HGB 14.9  HCT 45.4  MCV 84.1  PLT 284   Basic Metabolic Panel: Recent Labs  Lab 03/02/18 1301  NA 138  K 3.9  CL 92*  CO2 33*  GLUCOSE 134*  BUN 15  CREATININE 0.95  CALCIUM 9.2      Recent Labs  Lab 03/02/18 1301  AST 36  ALT 83*  ALKPHOS 188*  BILITOT 0.7  PROT 7.4  ALBUMIN 3.6   No results for input(s): LIPASE, AMYLASE in the last 168 hours. No results for input(s): AMMONIA in the last 168 hours.    HbA1C: No results for input(s): HGBA1C in the last 72 hours. CBG: No results for input(s): GLUCAP in the last 168 hours.    Urine analysis:    Component Value Date/Time   COLORURINE YELLOW 03/02/2018 1227   APPEARANCEUR CLOUDY (A) 03/02/2018 1227   LABSPEC 1.019 03/02/2018 1227   PHURINE 9.0 (H) 03/02/2018 1227   GLUCOSEU NEGATIVE 03/02/2018 1227   HGBUR NEGATIVE 03/02/2018 1227   BILIRUBINUR NEGATIVE 03/02/2018 1227   KETONESUR NEGATIVE 03/02/2018 1227   PROTEINUR 30 (A) 03/02/2018 1227   NITRITE NEGATIVE 03/02/2018 1227   LEUKOCYTESUR NEGATIVE 03/02/2018 1227      Cultures: No results found for: SDES, SPECREQUEST, CULT, REPTSTATUS    Radiological Exams on Admission: Dg Hand 2 View Left  Result Date: 03/02/2018 CLINICAL DATA:  Soft tissue swelling of the left hand. EXAM: LEFT HAND - 2 VIEW COMPARISON:  None. FINDINGS: There is no evidence of fracture or dislocation. There is no evidence of arthropathy or other focal bone abnormality. Marked soft tissue swelling dorsally. IMPRESSION: Marked soft tissue swelling of the left hand. No acute osseous abnormality seen. Electronically Signed   By: Ted Mcalpine M.D.   On: 03/02/2018 15:21    Chart has been reviewed    Assessment/Plan  37 y.o. male with medical history significant of IV drug use, multiple abscesses, osteomyelitis, hepatitis C  Admitted for left hand cellulitis in the setting of IV drug use.  Present on Admission: . Cellulitis - Cont IV Vanc for now if await results of wound cultures, in ER were unable to obtain blood culture.  If still here in AM would benefit from ID consult . IV drug user - not interested in stopping at this point, states he will likely leave AMA in the near future but agrees to stay until AM.  Tobacco abuse -  -  Spoke about importance of quitting,   - order nicotine patch   - nursing tobacco cessation protocol  Sepsis - cont IV fluids and IV antibiotics currently physiology improving on maximum medical therapy Other plan as per orders.  DVT prophylaxis:  SCD    Code Status:  DNR/DNI   as per patient   I had personally discussed CODE STATUS with patient Pt states he DOES NOT WaNT TO BE RESUSCITATED or INtubated   Family Communication:   Family not at  Bedside    Disposition Plan:     To home once workup is complete and patient is stable                                         Social Work  consulted        Consults called: hand surgery  Admission status:    inpatient     Expect 2 midnight stay secondary to severity of patient's current illness including   and extensive comorbidities including IV drug use I expect  patient  to be hospitalized for 2 midnights  requiring inpatient medical care. Patient is at high risk for adverse outcome (such as loss of life/limb or disability) if not treated. Indication for inpatient stay as follows:    Need for operative intervention Need for IV antibiotics        Level of care      medical floor              Ebonye Reade 03/03/2018, 12:08 AM    [facility]  Pager [phone]   after 2 AM please page floor coverage PA If 7AM-7PM, please contact the day team taking care of the patient  [email or web address]  Password [email or web address]

## 2018-03-02 NOTE — ED Notes (Signed)
Pt ambulated to the restroom with out difficulty.

## 2018-03-02 NOTE — ED Notes (Signed)
This NT attempted blood culture draw x2, unsuccessful. Notified Crystal(RN)

## 2018-03-02 NOTE — Progress Notes (Signed)
Pt placed on hold - awaiting admit orders from hospitalist, Dr Adela Glimpse. Pt's daughter and pt's wife  (separated from pt) in to see pt. Pt resting, belonging bags placed on bed by C. Ryker Pherigo, Charity fundraiser. Family to home. Pt voided and asked "what am I waiting for? I don't know why I can't just go home with the clinda and vanc pills like a did a few weeks ago in [place]." C. Zayley Arras explained pt needed IV antibiotics and orders to be admitted to the hospital. Pt fell back asleep at that time.

## 2018-03-02 NOTE — Progress Notes (Signed)
Chart checked for orders - noted that orders had been placed -bed request made

## 2018-03-02 NOTE — ED Provider Notes (Signed)
[facility] Emergency Department Provider Note MRN:  [id number]  Arrival date & time: 03/02/18     Chief Complaint   Hand Pain and Abdominal Pain   History of Present Illness   Brett [name] is a 37 y.o. year-old male with a history of IV drug use presenting to the ED with chief complaint of hand pain.  For the past 48 hours, patient has been experiencing progressively worsening left hand pain and swelling.  The entire hand is red, very swollen, moderate to severe pain.  Pain described as dull, nonradiating.  Denies any recent trauma to the hand.  Admits to IV drug use, but not in the left arm.  Denies chest pain or shortness of breath.  Endorsing recent subjective fevers.  Also reports having a hernia for the past year, yesterday had sudden onset pain and new mass in the left scrotum.  Mass and pain quickly went away, no issue since that time.  Review of Systems  A complete 10 system review of systems was obtained and all systems are negative except as noted in the HPI and PMH.   Patient's Health History    Past Medical History:  Diagnosis Date  . Hepatitis C     Past Surgical History:  Procedure Laterality Date  . TONSILLECTOMY      History reviewed. No pertinent family history.  Social History   Socioeconomic History  . Marital status: Married    Spouse name: Not on file  . Number of children: Not on file  . Years of education: Not on file  . Highest education level: Not on file  Occupational History  . Not on file  Social Needs  . Financial resource strain: Not on file  . Food insecurity:    Worry: Not on file    Inability: Not on file  . Transportation needs:    Medical: Not on file    Non-medical: Not on file  Tobacco Use  . Smoking status: Current Every Day Smoker    Packs/day: 0.50    Types: Cigarettes  . Smokeless tobacco: Never Used  Substance and Sexual Activity  . Alcohol use: No    Frequency: Never  . Drug use: Yes    Types:  Cocaine, Marijuana    Comment: on Methadone  . Sexual activity: Not on file  Lifestyle  . Physical activity:    Days per week: Not on file    Minutes per session: Not on file  . Stress: Not on file  Relationships  . Social connections:    Talks on phone: Not on file    Gets together: Not on file    Attends religious service: Not on file    Active member of club or organization: Not on file    Attends meetings of clubs or organizations: Not on file    Relationship status: Not on file  . Intimate partner violence:    Fear of current or ex partner: Not on file    Emotionally abused: Not on file    Physically abused: Not on file    Forced sexual activity: Not on file  Other Topics Concern  . Not on file  Social History Narrative  . Not on file     Physical Exam  Vital Signs and Nursing Notes reviewed Vitals:   03/02/18 2050 03/02/18 2105  BP: 123/70 (!) 144/83  Pulse: 85 83  Resp: 12 12  Temp:    SpO2: 94% 94%    CONSTITUTIONAL:  Well-appearing, NAD, mildly diaphoretic NEURO:  Alert and oriented x 3, no focal deficits EYES:  eyes equal and reactive ENT/NECK:  no LAD, no JVD CARDIO: Tachycardic rate, well-perfused, normal S1 and S2 PULM:  CTAB no wheezing or rhonchi GI/GU:  normal bowel sounds, non-distended, non-tender MSK/SPINE: Moderate diffuse swelling to the left hand with reduced range of motion due to pain, tender to palpation, neurovascularly intact. SKIN:  no rash, atraumatic PSYCH:  Appropriate speech and behavior  Diagnostic and Interventional Summary    EKG Interpretation  Date/Time:    Ventricular Rate:    PR Interval:    QRS Duration:   QT Interval:    QTC Calculation:   R Axis:     Text Interpretation:        Labs Reviewed  COMPREHENSIVE METABOLIC PANEL - Abnormal; Notable for the following components:      Result Value   Chloride 92 (*)    CO2 33 (*)    Glucose, Bld 134 (*)    ALT 83 (*)    Alkaline Phosphatase 188 (*)    All other  components within normal limits  CBC WITH DIFFERENTIAL/PLATELET - Abnormal; Notable for the following components:   WBC 10.7 (*)    Monocytes Absolute 1.3 (*)    All other components within normal limits  URINALYSIS, ROUTINE W REFLEX MICROSCOPIC - Abnormal; Notable for the following components:   APPearance CLOUDY (*)    pH 9.0 (*)    Protein, ur 30 (*)    Bacteria, UA RARE (*)    All other components within normal limits  CULTURE, BLOOD (ROUTINE X 2)  AEROBIC/ANAEROBIC CULTURE (SURGICAL/DEEP WOUND)  CULTURE, BLOOD (ROUTINE X 2) W REFLEX TO ID PANEL  MRSA PCR SCREENING  I-STAT CG4 LACTIC ACID, ED  I-STAT CG4 LACTIC ACID, ED    DG Hand 2 View Left  Final Result      Medications  vancomycin (VANCOCIN) IVPB 1000 mg/200 mL premix ( Intravenous Automatically Held 03/10/18 2300)  chlorhexidine (HIBICLENS) 4 % liquid 4 application (has no administration in time range)  povidone-iodine 10 % swab 2 application (has no administration in time range)  clindamycin (CLEOCIN) IVPB 900 mg (has no administration in time range)  fentaNYL (SUBLIMAZE) injection 25-50 mcg (has no administration in time range)  oxyCODONE (Oxy IR/ROXICODONE) immediate release tablet 5 mg (has no administration in time range)    Or  oxyCODONE (ROXICODONE) 5 MG/5ML solution 5 mg (has no administration in time range)  fentaNYL (SUBLIMAZE) injection 25-50 mcg (has no administration in time range)  ondansetron (ZOFRAN) injection 4 mg (has no administration in time range)  midazolam (VERSED) injection 2 mg (has no administration in time range)  sodium chloride 0.9 % bolus 1,000 mL (0 mLs Intravenous Stopped 03/02/18 1619)    And  sodium chloride 0.9 % bolus 1,000 mL (1,000 mLs Intravenous New Bag/Given 03/02/18 1637)    And  sodium chloride 0.9 % bolus 500 mL (500 mLs Intravenous New Bag/Given 03/02/18 1637)  vancomycin (VANCOCIN) 1,500 mg in sodium chloride 0.9 % 500 mL IVPB (1,500 mg Intravenous New Bag/Given 03/02/18 1637)    cefTRIAXone (ROCEPHIN) 2 g in sodium chloride 0.9 % 100 mL IVPB (0 g Intravenous Stopped 03/02/18 1619)     Procedures Critical Care Critical Care Documentation Critical care time provided by me (excluding procedures): 35 minutes  Condition necessitating critical care: Sepsis, deep space infection of the hand  Components of critical care management: reviewing of prior records, laboratory and imaging  interpretation, frequent re-examination and reassessment of vital signs, administration of IV fluid resuscitation, IV antibiotics, discussion with consulting services.    ED Course and Medical Decision Making  I have reviewed the triage vital signs and the nursing notes.  Pertinent labs & imaging results that were available during my care of the patient were reviewed by me and considered in my medical decision making (see below for details).    Concern for deep space infection of the hand and this 37 year old male, history of IV drug abuse.  Mildly tachycardic, endorsing subjective fevers, concern for sepsis of soft tissue origin.  Given vancomycin, ceftriaxone, IV fluids, hand surgery consulted, brought to the OR for I&D.  Admitted to hospital service for further care and evaluation.  Elmer Sow. Pilar Plate, MD [facility] Emergency Medicine [facility] [email or web address]  Final Clinical Impressions(s) / ED Diagnoses     ICD-10-CM   1. Soft tissue infection L08.9   2. Hand abscess L02.519   3. Sepsis, due to unspecified organism [facility]) A41.9     ED Discharge Orders    None         Sabas Sous, MD 03/02/18 2158

## 2018-03-02 NOTE — ED Notes (Signed)
Called patient and no answer.

## 2018-03-02 NOTE — Progress Notes (Signed)
Lab here to draw 2nd blood culture - C. Zeddie Njie, RN verbally made lab tech aware that pt had already received vanco & cleocin IV in OR- C.Doni Bacha also confirmed Vancomycin IV dosing time change with main pharmacy based on dose given in OR. Next dose due at 0100 on 03/03/18.

## 2018-03-02 NOTE — ED Triage Notes (Signed)
Patient left hand is swelling. Patient states he is having lower abdominal pain. Patient states he feels like he has large growth above left testicle. This started over a year ago. Patients left hand started swelling 4 days ago.

## 2018-03-02 NOTE — ED Notes (Signed)
Attempted to collect labs and was unsuccessful 

## 2018-03-02 NOTE — ED Notes (Signed)
Pt has returned to the ER waiting room.

## 2018-03-02 NOTE — Anesthesia Postprocedure Evaluation (Signed)
Anesthesia Post Note  Patient: Brett [name]  Procedure(s) Performed: IRRIGATION AND DEBRIDEMENT EXTREMITY (Left Hand)     Patient location during evaluation: PACU Anesthesia Type: General Level of consciousness: awake and alert Pain management: pain level controlled Vital Signs Assessment: post-procedure vital signs reviewed and stable Respiratory status: spontaneous breathing, nonlabored ventilation, respiratory function stable and patient connected to nasal cannula oxygen Cardiovascular status: blood pressure returned to baseline and stable Postop Assessment: no apparent nausea or vomiting Anesthetic complications: no    Last Vitals:  Vitals:   03/02/18 2050 03/02/18 2105  BP: 123/70 (!) 144/83  Pulse: 85 83  Resp: 12 12  Temp:    SpO2: 94% 94%    Last Pain:  Vitals:   03/02/18 2105  TempSrc:   PainSc: Asleep                 Andra Heslin COKER

## 2018-03-02 NOTE — ED Notes (Signed)
Pt was seen outside and leaving ER valet area.

## 2018-03-02 NOTE — Op Note (Signed)
PREOPERATIVE DIAGNOSIS:left hand dorsal infection  POSTOPERATIVE DIAGNOSIS:same  ATTENDING SURGEON:Dr. Bradly Bienenstock who was scrubbed and present for the entire procedure  ASSISTANT SURGEON:none  ANESTHESIA:Gen. Via endotracheal tube  OPERATIVE PROCEDURE: #1: Left long finger capsulotomy metacarpophalangeal joint #2: Left hand fourth dorsal compartment extensor tenosynovitis and tenosynovectomy #3: Left long finger metacarpophalangeal joint arthrotomy exploration and drainage  IMPLANTS:none  RADIOGRAPHIC INTERPRETATION:none  SURGICAL INDICATIONS:patient is a right-hand-dominant gentleman who continues to use IV drugs. The patient presented with worsening pain and swelling in his left hand. Patient is recommended undergo the above procedure. Risks of surgery include but not limited to bleeding infection damage to nearby nerves arteries or tendons loss of motion of the wrists and digits incomplete relief of symptoms and need for further surgical intervention  SURGICAL TECHNIQUE:patient is properly divided the preoperative holding area marked for permanent marker made a left hand indicate correct operative site. Patient is then brought back to operating room placed supine on anesthesia and table where general anesthesia was administered. Patient tolerated this well. A well-padded tourniquet was then placed on the left brachium and sealed with the appropriate drape. The left upper extremities and prepped and draped in normal sterile fashion. A timeout was called the correct site was identified and the procedure then begun. Attention then turned to the left hand longitudinal incision made directly over the dorsal aspect of the long finger metacarpal dissection then carried down through the skin and subcutaneous tissues after the tourniquet insufflated. A large amount of fluid was noted within the fourth dorsal compartment. Aggressive extensor tenosynovectomy was then carried out of the fourth dorsal  compartment removing the inflammatory and infectious tissue. This was done sharply with a knife and Roger. After aggressive tenosynovectomy the long finger MCP joint capsulotomy was then carried out. After capsulotomy the joint was noted to have the infectious material coming from the joint. Arthrotomy exploration and drainage then carried out and the wound was then thoroughly irrigated. Joint thorough irrigation was then carried out. After thorough wound irrigation and debridement the wound was then loosely reapproximated with simple Prolene sutures. Adaptic dressing a sterile compressive bandage then applied. The patient tolerated the procedure well was placed in well-padded volar splint explained taken recovery room in good condition. intraoperative wound cultures were then taken.  POSTOPERATIVE PLAN:patient be admitted to the internal medicine service IV antibiotics and pain control. Wound cultures are to be followed. Continue with the splint. He will need to follow up with  In the office on Monday for wound check and splint change

## 2018-03-02 NOTE — ED Triage Notes (Signed)
Pt to ER for evaluation of left hand pain and swelling x4 days. States is an IV drug user but did not "shoot up this hand." Also states left inguinal hernia presence, that is reducible at this time but reports he is having trouble urinating and having bowel movements.

## 2018-03-02 NOTE — Anesthesia Procedure Notes (Signed)
Procedure Name: Intubation Date/Time: 03/02/2018 7:21 PM Performed by: Claris Che, CRNA Pre-anesthesia Checklist: Patient identified, Emergency Drugs available, Suction available, Patient being monitored and Timeout performed Patient Re-evaluated:Patient Re-evaluated prior to induction Oxygen Delivery Method: Circle system utilized Preoxygenation: Pre-oxygenation with 100% oxygen Induction Type: IV induction, Rapid sequence and Cricoid Pressure applied Laryngoscope Size: Mac and 3 Grade View: Grade II Tube type: Oral Tube size: 7.5 mm Number of attempts: 1 Airway Equipment and Method: Stylet Placement Confirmation: ETT inserted through vocal cords under direct vision,  positive ETCO2 and breath sounds checked- equal and bilateral Secured at: 23 cm Tube secured with: Tape Dental Injury: Teeth and Oropharynx as per pre-operative assessment

## 2018-03-02 NOTE — Transfer of Care (Signed)
Immediate Anesthesia Transfer of Care Note  Patient: Brett [name]  Procedure(s) Performed: IRRIGATION AND DEBRIDEMENT EXTREMITY (Left Hand)  Patient Location: PACU  Anesthesia Type:General  Level of Consciousness: sedated, drowsy, patient cooperative and responds to stimulation  Airway & Oxygen Therapy: Patient Spontanous Breathing and Patient connected to nasal cannula oxygen  Post-op Assessment: Report given to RN, Post -op Vital signs reviewed and stable and Patient moving all extremities X 4  Post vital signs: Reviewed and stable  Last Vitals:  Vitals Value Taken Time  BP 140/93 03/02/2018  8:04 PM  Temp 36.4 C 03/02/2018  8:04 PM  Pulse 89 03/02/2018  8:08 PM  Resp 12 03/02/2018  8:08 PM  SpO2 98 % 03/02/2018  8:08 PM  Vitals shown include unvalidated device data.  Last Pain:  Vitals:   03/02/18 2004  TempSrc:   PainSc: Asleep         Complications: No apparent anesthesia complications

## 2018-03-02 NOTE — ED Notes (Signed)
Patient transported to X-ray 

## 2018-03-02 NOTE — ED Notes (Signed)
Pt informed he needs to provide UA when possible. Pt verbalized understanding; does not have to go at this time.

## 2018-03-02 NOTE — Progress Notes (Signed)
Pharmacy Antibiotic Note  Omauri Mcgougan is a 37 y.o. male admitted on 03/02/2018 with sepsis.  Pharmacy has been consulted for Vancomycin dosing.  Plan: Vancomycin 1500 mg IV x 1, then 1000 mg IV q8hr Monitor Scr, CBC, cultures and sensitivities. Will obtain Vancomycin trough at steady state.   Temp (24hrs), Avg:98.5 F (36.9 C), Min:98.4 F (36.9 C), Max:98.6 F (37 C)  Recent Labs  Lab 03/02/18 1301 03/02/18 1309  WBC 10.7*  --   CREATININE 0.95  --   LATICACIDVEN  --  1.15    Estimated Creatinine Clearance: 117.2 mL/min (by C-G formula based on SCr of 0.95 mg/dL).    Allergies  Allergen Reactions  . Penicillins     Antimicrobials this admission: Vanc 9/4 >>  Thank you for allowing pharmacy to be a part of this patient's care.  Jeanella Cara, PharmD, [facility] Clinical Pharmacist Please see AMION for all Pharmacists' Contact Phone Numbers 03/02/2018, 2:53 PM

## 2018-03-02 NOTE — ED Notes (Signed)
Called patient back to room and no answer. 

## 2018-03-02 NOTE — Anesthesia Preprocedure Evaluation (Addendum)
Anesthesia Evaluation  Patient identified by MRN, date of birth, ID band Patient awake    Reviewed: Allergy & Precautions, NPO status , Patient's Chart, lab work & pertinent test results  Airway Mallampati: II  TM Distance: >3 FB Neck ROM: Full    Dental  (+) Poor Dentition, Dental Advisory Given, Teeth Intact   Pulmonary Current Smoker,    breath sounds clear to auscultation       Cardiovascular  Rhythm:Regular Rate:Normal     Neuro/Psych    GI/Hepatic   Endo/Other    Renal/GU      Musculoskeletal   Abdominal   Peds  Hematology   Anesthesia Other Findings   Reproductive/Obstetrics                            Anesthesia Physical Anesthesia Plan  ASA: III and emergent  Anesthesia Plan: General   Post-op Pain Management:    Induction: Intravenous, Cricoid pressure planned and Rapid sequence  PONV Risk Score and Plan: Ondansetron and Dexamethasone  Airway Management Planned: Oral ETT  Additional Equipment:   Intra-op Plan:   Post-operative Plan: Extubation in OR  Informed Consent: I have reviewed the patients History and Physical, chart, labs and discussed the procedure including the risks, benefits and alternatives for the proposed anesthesia with the patient or authorized representative who has indicated his/her understanding and acceptance.   Dental advisory given  Plan Discussed with: CRNA and Anesthesiologist  Anesthesia Plan Comments:         Anesthesia Quick Evaluation

## 2018-03-03 ENCOUNTER — Encounter (HOSPITAL_COMMUNITY): Payer: Self-pay | Admitting: Orthopedic Surgery

## 2018-03-03 DIAGNOSIS — L02519 Cutaneous abscess of unspecified hand: Secondary | ICD-10-CM

## 2018-03-03 DIAGNOSIS — L089 Local infection of the skin and subcutaneous tissue, unspecified: Secondary | ICD-10-CM

## 2018-03-03 LAB — CBC
HCT: 38.3 % — ABNORMAL LOW (ref 39.0–52.0)
HEMOGLOBIN: 12.5 g/dL — AB (ref 13.0–17.0)
MCH: 27.7 pg (ref 26.0–34.0)
MCHC: 32.6 g/dL (ref 30.0–36.0)
MCV: 84.9 fL (ref 78.0–100.0)
Platelets: 202 10*3/uL (ref 150–400)
RBC: 4.51 MIL/uL (ref 4.22–5.81)
RDW: 13.4 % (ref 11.5–15.5)
WBC: 9.7 10*3/uL (ref 4.0–10.5)

## 2018-03-03 LAB — HIV ANTIBODY (ROUTINE TESTING W REFLEX): HIV Screen 4th Generation wRfx: NONREACTIVE

## 2018-03-03 LAB — COMPREHENSIVE METABOLIC PANEL
ALK PHOS: 156 U/L — AB (ref 38–126)
ALT: 94 U/L — ABNORMAL HIGH (ref 0–44)
ANION GAP: 6 (ref 5–15)
AST: 73 U/L — ABNORMAL HIGH (ref 15–41)
Albumin: 2.8 g/dL — ABNORMAL LOW (ref 3.5–5.0)
BUN: 10 mg/dL (ref 6–20)
CALCIUM: 8 mg/dL — AB (ref 8.9–10.3)
CO2: 26 mmol/L (ref 22–32)
Chloride: 104 mmol/L (ref 98–111)
Creatinine, Ser: 0.72 mg/dL (ref 0.61–1.24)
GFR calc non Af Amer: >60 mL/min (ref >60)
Glucose, Bld: 145 mg/dL — ABNORMAL HIGH (ref 70–99)
Potassium: 3.7 mmol/L (ref 3.5–5.1)
SODIUM: 136 mmol/L (ref 135–145)
Total Bilirubin: 0.6 mg/dL (ref 0.3–1.2)
Total Protein: 6 g/dL — ABNORMAL LOW (ref 6.5–8.1)

## 2018-03-03 LAB — MRSA PCR SCREENING: MRSA by PCR: NEGATIVE

## 2018-03-03 LAB — PHOSPHORUS: Phosphorus: 2.9 mg/dL (ref 2.5–4.6)

## 2018-03-03 LAB — TSH: TSH: 0.566 u[IU]/mL (ref 0.350–4.500)

## 2018-03-03 LAB — MAGNESIUM: MAGNESIUM: 1.8 mg/dL (ref 1.7–2.4)

## 2018-03-03 MED ORDER — DOXYCYCLINE HYCLATE 50 MG PO CAPS: 50.0000 mg | ORAL_CAPSULE | Freq: Two times a day (BID) | ORAL | 0 refills | Status: DC

## 2018-03-03 MED ORDER — SULFAMETHOXAZOLE-TRIMETHOPRIM 800-160 MG PO TABS: 1.0000 | ORAL_TABLET | Freq: Two times a day (BID) | ORAL | 0 refills | Status: DC

## 2018-03-03 NOTE — Care Management Note (Signed)
Case Management Note  Patient Details  Name: Brett [name] MRN: [id number] Date of Birth: 1981/04/16  Subjective/Objective:                    Action/Plan:  Provided MATCH letter, entered over ride for co pay. Both prescriptions will be filled in full if patient goes to one of the pharmacies listed ( 2 pages of pharmacies) . Patient voiced understanding. Expected Discharge Date:  03/03/18               Expected Discharge Plan:  Home/Self Care  In-House Referral:  Financial Counselor  Discharge planning Services  CM Consult, MATCH Program, [facility], Medication Assistance  Post Acute Care Choice:  NA Choice offered to:  Patient  DME Arranged:  N/A DME Agency:  NA  HH Arranged:  NA HH Agency:     Status of Service:  Completed, signed off  If discussed at [employer] of Stay Meetings, dates discussed:    Additional Comments:  Kingsley Plan, RN 03/03/2018, 11:31 AM

## 2018-03-03 NOTE — Discharge Summary (Signed)
Physician Discharge Summary  Brett [name] MRN: [id number] DOB/AGE: 39-06-1980 39 y.o.  PCP: Patient, No Pcp Per   Admit date: 03/02/2018 Discharge date: 03/03/2018  Discharge Diagnoses:    Active Problems:   Cellulitis   IV drug user    Follow-up recommendations Follow-up with PCP in 3-5 days , including all  additional recommended appointments as below Follow-up CBC, CMP in 3-5 days Patient left AGAINST MEDICAL ADVICE 2 antibiotics prescribed for 10 days      Allergies as of 03/03/2018      Reactions   Penicillins Shortness Of Breath, Itching, Rash   Has patient had a PCN reaction causing immediate rash, facial/tongue/throat swelling, SOB or lightheadedness with hypotension: Yes Has patient had a PCN reaction causing severe rash involving mucus membranes or skin necrosis: Unk Has patient had a PCN reaction that required hospitalization: Unk Has patient had a PCN reaction occurring within the last 10 years: No If all of the above answers are "NO", then may proceed with Cephalosporin use.      Medication List    TAKE these medications   doxycycline 50 MG capsule Commonly known as:  VIBRAMYCIN Take 1 capsule (50 mg total) by mouth 2 (two) times daily.   sulfamethoxazole-trimethoprim 800-160 MG tablet Commonly known as:  BACTRIM DS,SEPTRA DS Take 1 tablet by mouth 2 (two) times daily.        Discharge Condition:  Left AGAINST MEDICAL ADVICE  Discharge Instructions Get Medicines reviewed and adjusted: Please take all your medications with you for your next visit with your Primary MD  Please request your Primary MD to go over all hospital tests and procedure/radiological results at the follow up, please ask your Primary MD to get all Hospital records sent to his/her office.  If you experience worsening of your admission symptoms, develop shortness of breath, life threatening emergency, suicidal or homicidal thoughts you must seek medical attention immediately by  calling 911 or calling your MD immediately  if symptoms less severe.  You must read complete instructions/literature along with all the possible adverse reactions/side effects for all the Medicines you take and that have been prescribed to you. Take any new Medicines after you have completely understood and accpet all the possible adverse reactions/side effects.   Do not drive when taking Pain medications.   Do not take more than prescribed Pain, Sleep and Anxiety Medications  Special Instructions: If you have smoked or chewed Tobacco  in the last 2 yrs please stop smoking, stop any regular Alcohol  and or any Recreational drug use.  Wear Seat belts while driving.  Please note  You were cared for by a hospitalist during your hospital stay. Once you are discharged, your primary care physician will handle any further medical issues. Please note that NO REFILLS for any discharge medications will be authorized once you are discharged, as it is imperative that you return to your primary care physician (or establish a relationship with a primary care physician if you do not have one) for your aftercare needs so that they can reassess your need for medications and monitor your lab values.     Allergies  Allergen Reactions  . Penicillins Shortness Of Breath, Itching and Rash    Has patient had a PCN reaction causing immediate rash, facial/tongue/throat swelling, SOB or lightheadedness with hypotension: Yes Has patient had a PCN reaction causing severe rash involving mucus membranes or skin necrosis: Unk Has patient had a PCN reaction that required hospitalization: Unk Has patient had  a PCN reaction occurring within the last 10 years: No If all of the above answers are "NO", then may proceed with Cephalosporin use.       Disposition: Discharge disposition: 01-Home or Self Care        Consults:  none    Significant Diagnostic Studies:  Dg Hand 2 View Left  Result Date:  03/02/2018 CLINICAL DATA:  Soft tissue swelling of the left hand. EXAM: LEFT HAND - 2 VIEW COMPARISON:  None. FINDINGS: There is no evidence of fracture or dislocation. There is no evidence of arthropathy or other focal bone abnormality. Marked soft tissue swelling dorsally. IMPRESSION: Marked soft tissue swelling of the left hand. No acute osseous abnormality seen. Electronically Signed   By: Fidela Salisbury M.D.   On: 03/02/2018 15:21       There were no vitals filed for this visit.   Microbiology: Recent Results (from the past 240 hour(s))  Blood Culture (routine x 2)     Status: None (Preliminary result)   Collection Time: 03/02/18  3:03 PM  Result Value Ref Range Status   Specimen Description BLOOD BLOOD RIGHT ARM  Final   Special Requests   Final    BOTTLES DRAWN AEROBIC AND ANAEROBIC Blood Culture results may not be optimal due to an inadequate volume of blood received in culture bottles   Culture   Final    NO GROWTH < 24 HOURS Performed at [facility], [place] [address], [place], [place] [address]    Report Status PENDING  Incomplete  Aerobic/Anaerobic Culture (surgical/deep wound)     Status: None (Preliminary result)   Collection Time: 03/02/18  7:31 PM  Result Value Ref Range Status   Specimen Description ABSCESS LEFT HAND  Final   Special Requests PT ON VANC  Final   Gram Stain   Final    RARE WBC PRESENT,BOTH PMN AND MONONUCLEAR NO ORGANISMS SEEN    Culture   Final    NO GROWTH < 12 HOURS Performed at [facility], [place] [address], [place], [place] [address]    Report Status PENDING  Incomplete  MRSA PCR Screening     Status: None   Collection Time: 03/02/18 11:30 PM  Result Value Ref Range Status   MRSA by PCR NEGATIVE NEGATIVE Final    Comment:        The GeneXpert MRSA Assay (FDA approved for NASAL specimens only), is one component of a comprehensive MRSA colonization surveillance program. It is not intended to diagnose MRSA infection nor to  guide or monitor treatment for MRSA infections. Performed at [facility], [place] [address], [place], [place] [address]        Blood Culture    Component Value Date/Time   SDES ABSCESS LEFT HAND 03/02/2018 1931   SPECREQUEST PT ON VANC 03/02/2018 1931   CULT  03/02/2018 1931    NO GROWTH < 12 HOURS Performed at [facility] [address], [place],  [address]    REPTSTATUS PENDING 03/02/2018 1931      Labs: Results for orders placed or performed during the hospital encounter of 03/02/18 (from the past 48 hour(s))  Urinalysis, Routine w reflex microscopic     Status: Abnormal   Collection Time: 03/02/18 12:27 PM  Result Value Ref Range   Color, Urine YELLOW YELLOW   APPearance CLOUDY (A) CLEAR   Specific Gravity, Urine 1.019 1.005 - 1.030   pH 9.0 (H) 5.0 - 8.0  Glucose, UA NEGATIVE NEGATIVE mg/dL   Hgb urine dipstick NEGATIVE NEGATIVE   Bilirubin Urine NEGATIVE NEGATIVE   Ketones, ur NEGATIVE NEGATIVE mg/dL   Protein, ur 30 (A) NEGATIVE mg/dL   Nitrite NEGATIVE NEGATIVE   Leukocytes, UA NEGATIVE NEGATIVE   WBC, UA 11-20 0 - 5 WBC/hpf   Bacteria, UA RARE (A) NONE SEEN   Squamous Epithelial / LPF 0-5 0 - 5   Amorphous Crystal PRESENT     Comment: Performed at [facility] [address], [place], [place] [address]  Comprehensive metabolic panel     Status: Abnormal   Collection Time: 03/02/18  1:01 PM  Result Value Ref Range   Sodium 138 135 - 145 mmol/L   Potassium 3.9 3.5 - 5.1 mmol/L   Chloride 92 (L) 98 - 111 mmol/L   CO2 33 (H) 22 - 32 mmol/L   Glucose, Bld 134 (H) 70 - 99 mg/dL   BUN 15 6 - 20 mg/dL   Creatinine, Ser 0.95 0.61 - 1.24 mg/dL   Calcium 9.2 8.9 - 10.3 mg/dL   Total Protein 7.4 6.5 - 8.1 g/dL   Albumin 3.6 3.5 - 5.0 g/dL   AST 36 15 - 41 U/L   ALT 83 (H) 0 - 44 U/L   Alkaline Phosphatase 188 (H) 38 - 126 U/L   Total Bilirubin 0.7 0.3 - 1.2 mg/dL   GFR calc non Af Amer >60 >60 mL/min   GFR calc Af Amer >60 >60  mL/min    Comment: (NOTE) The eGFR has been calculated using the CKD EPI equation. This calculation has not been validated in all clinical situations. eGFR's persistently <60 mL/min signify possible Chronic Kidney Disease.    Anion gap 13 5 - 15    Comment: Performed at [facility] [address], [place], [place] [address]  CBC with Differential     Status: Abnormal   Collection Time: 03/02/18  1:01 PM  Result Value Ref Range   WBC 10.7 (H) 4.0 - 10.5 K/uL   RBC 5.40 4.22 - 5.81 MIL/uL   Hemoglobin 14.9 13.0 - 17.0 g/dL   HCT 45.4 39.0 - 52.0 %   MCV 84.1 78.0 - 100.0 fL   MCH 27.6 26.0 - 34.0 pg   MCHC 32.8 30.0 - 36.0 g/dL   RDW 13.6 11.5 - 15.5 %   Platelets 284 150 - 400 K/uL   Neutrophils Relative % 62 %   Neutro Abs 6.7 1.7 - 7.7 K/uL   Lymphocytes Relative 22 %   Lymphs Abs 2.4 0.7 - 4.0 K/uL   Monocytes Relative 12 %   Monocytes Absolute 1.3 (H) 0.1 - 1.0 K/uL   Eosinophils Relative 2 %   Eosinophils Absolute 0.2 0.0 - 0.7 K/uL   Basophils Relative 1 %   Basophils Absolute 0.1 0.0 - 0.1 K/uL   Immature Granulocytes 1 %   Abs Immature Granulocytes 0.1 0.0 - 0.1 K/uL    Comment: Performed at [facility] [address] [place], [place] [address]  I-Stat CG4 Lactic Acid, ED     Status: None   Collection Time: 03/02/18  1:09 PM  Result Value Ref Range   Lactic Acid, Venous 1.15 0.5 - 1.9 mmol/L  Blood Culture (routine x 2)     Status: None (Preliminary result)   Collection Time: 03/02/18  3:03 PM  Result Value Ref Range   Specimen Description BLOOD BLOOD RIGHT ARM    Special Requests  BOTTLES DRAWN AEROBIC AND ANAEROBIC Blood Culture results may not be optimal due to an inadequate volume of blood received in culture bottles   Culture      NO GROWTH < 24 HOURS Performed at [facility] [address], [place], [place] [address]    Report Status PENDING   I-Stat CG4 Lactic Acid, ED     Status: None   Collection Time: 03/02/18  3:09 PM   Result Value Ref Range   Lactic Acid, Venous 1.56 0.5 - 1.9 mmol/L  Aerobic/Anaerobic Culture (surgical/deep wound)     Status: None (Preliminary result)   Collection Time: 03/02/18  7:31 PM  Result Value Ref Range   Specimen Description ABSCESS LEFT HAND    Special Requests PT ON VANC    Gram Stain      RARE WBC PRESENT,BOTH PMN AND MONONUCLEAR NO ORGANISMS SEEN    Culture      NO GROWTH < 12 HOURS Performed at [facility], [place] [address] [place], [place] [address]    Report Status PENDING   MRSA PCR Screening     Status: None   Collection Time: 03/02/18 11:30 PM  Result Value Ref Range   MRSA by PCR NEGATIVE NEGATIVE    Comment:        The GeneXpert MRSA Assay (FDA approved for NASAL specimens only), is one component of a comprehensive MRSA colonization surveillance program. It is not intended to diagnose MRSA infection nor to guide or monitor treatment for MRSA infections. Performed at [facility], [place] [address], [place], [place] [address]   Magnesium     Status: None   Collection Time: 03/03/18 12:41 AM  Result Value Ref Range   Magnesium 1.8 1.7 - 2.4 mg/dL    Comment: Performed at [facility] [address], [place], [place] [address]  Phosphorus     Status: None   Collection Time: 03/03/18 12:41 AM  Result Value Ref Range   Phosphorus 2.9 2.5 - 4.6 mg/dL    Comment: Performed at [facility] [address] [place], [place] [address]  TSH     Status: None   Collection Time: 03/03/18 12:41 AM  Result Value Ref Range   TSH 0.566 0.350 - 4.500 uIU/mL    Comment: Performed by a 3rd Generation assay with a functional sensitivity of <=0.01 uIU/mL. Performed at [facility], [place] [address], [place],  [address]   Comprehensive metabolic panel     Status: Abnormal   Collection Time: 03/03/18 12:41 AM  Result Value Ref Range   Sodium 136 135 - 145 mmol/L   Potassium 3.7 3.5 - 5.1 mmol/L   Chloride 104 98 - 111 mmol/L    CO2 26 22 - 32 mmol/L   Glucose, Bld 145 (H) 70 - 99 mg/dL   BUN 10 6 - 20 mg/dL   Creatinine, Ser 0.72 0.61 - 1.24 mg/dL   Calcium 8.0 (L) 8.9 - 10.3 mg/dL   Total Protein 6.0 (L) 6.5 - 8.1 g/dL   Albumin 2.8 (L) 3.5 - 5.0 g/dL   AST 73 (H) 15 - 41 U/L   ALT 94 (H) 0 - 44 U/L   Alkaline Phosphatase 156 (H) 38 - 126 U/L   Total Bilirubin 0.6 0.3 - 1.2 mg/dL   GFR calc non Af Amer >60 >60 mL/min   GFR calc Af Amer >60 >60 mL/min    Comment: (NOTE) The eGFR has  been calculated using the CKD EPI equation. This calculation has not been validated in all clinical situations. eGFR's persistently <60 mL/min signify possible Chronic Kidney Disease.    Anion gap 6 5 - 15    Comment: Performed at [facility] [address], [place], [place] [address]  CBC     Status: Abnormal   Collection Time: 03/03/18 12:41 AM  Result Value Ref Range   WBC 9.7 4.0 - 10.5 K/uL   RBC 4.51 4.22 - 5.81 MIL/uL   Hemoglobin 12.5 (L) 13.0 - 17.0 g/dL   HCT 38.3 (L) 39.0 - 52.0 %   MCV 84.9 78.0 - 100.0 fL   MCH 27.7 26.0 - 34.0 pg   MCHC 32.6 30.0 - 36.0 g/dL   RDW 13.4 11.5 - 15.5 %   Platelets 202 150 - 400 K/uL    Comment: Performed at [facility], [place] [address], [place], [place] [address]     Lipid Panel  No results found for: CHOL, TRIG, HDL, CHOLHDL, VLDL, LDLCALC, LDLDIRECT   No results found for: HGBA1C   Lab Results  Component Value Date   CREATININE 0.72 03/03/2018     HPI   37 y.o. male with medical history significant of IV drug use, multiple abscesses, osteomyelitis, hepatitis C, tobacco abuse    Presented with   Left hand swelling for the past 4 days.  Getting worse for the past 2 days he presented today to emergency department first at 2 AM but left without being seen and came back again around noon.  Patient was noted to leave the emergency department and then returned.  Patient admits to using IV drugs but states he never injected in that hand.  Patient was also  informing ER staff that he has left inguinal hernia which has been interfering his urination states today he had an episode of pain in his left scrotum but now resolved Associated chest pain or shortness of breath have been having subjective fevers Prior to this he has been seen in November 2018 for right elbow cellulitis  HOSPITAL COURSE:   . Cellulitis -  Patient started on IV Vanc, pending wound cultures, in ER were unable to obtain blood culture. Left AGAINST MEDICAL ADVICE, given prescription for Bactrim and doxycycline for 10-14 days  . IV drug user - not interested in stopping at this point, states he will likely leave AMA in the near future but agrees to stay until AM.   Tobacco abuse -  -  Spoke about importance of quitting,     Discharge Exam  Blood pressure 125/78, pulse 79, temperature 100.1 F (37.8 C), temperature source Oral, resp. rate 16, SpO2 100 %.   General:  in No Acute distress   Chronically ill  deshevelled -appearing 2. Psychological: Alert and   Oriented 3. Head/ENT:    Dry Mucous Membranes                          Head Non traumatic, neck supple                           Poor Dentition 4. SKIN:  decreased Skin turgor,  Skin clean Dry and intact no rash, Left arm in Dressing,   5. Heart: Regular rate and rhythm no  Murmur, no Rub or gallop 6. Lungs:   no wheezes or crackles   7. Abdomen: Soft,  non-tender,  Non distended  bowel sounds present 8. Lower extremities: no clubbing, cyanosis, or  edema 9. Neurologically Grossly intact, moving all 4 extremities equally        Signed: Reyne Dumas 03/03/2018, 10:52 AM      Time needed to  prepare  discharge, discussed with the patient and family 35 minutes

## 2018-03-03 NOTE — Progress Notes (Signed)
Pt unscrewed his iv and left fluids flowing on the floor. IV reconnected and pt called again within 30 minutes and said that his iv came out. Pt then voiced to this nurse that he wants his scripts so he can leave. Educated on risks of sepsis. MD paged and notified. Will process AMA paperwork

## 2018-03-03 NOTE — Progress Notes (Signed)
Pt signed AMA paperwork and left the unit. MATCH letter given and electronic scripts for antibiotics sent to his pharmacy of choice

## 2018-03-04 ENCOUNTER — Telehealth: Payer: Self-pay | Admitting: Internal Medicine

## 2018-03-04 LAB — BLOOD CULTURE ID PANEL (REFLEXED)
ACINETOBACTER BAUMANNII: NOT DETECTED
CANDIDA ALBICANS: NOT DETECTED
CANDIDA KRUSEI: NOT DETECTED
CANDIDA PARAPSILOSIS: NOT DETECTED
CARBAPENEM RESISTANCE: NOT DETECTED
Candida glabrata: NOT DETECTED
Candida tropicalis: NOT DETECTED
ENTEROBACTER CLOACAE COMPLEX: NOT DETECTED
Enterobacteriaceae species: NOT DETECTED
Enterococcus species: NOT DETECTED
Escherichia coli: NOT DETECTED
HAEMOPHILUS INFLUENZAE: NOT DETECTED
KLEBSIELLA OXYTOCA: NOT DETECTED
KLEBSIELLA PNEUMONIAE: NOT DETECTED
Listeria monocytogenes: NOT DETECTED
Neisseria meningitidis: NOT DETECTED
PSEUDOMONAS AERUGINOSA: DETECTED — AB
Proteus species: NOT DETECTED
STREPTOCOCCUS PYOGENES: NOT DETECTED
STREPTOCOCCUS SPECIES: NOT DETECTED
Serratia marcescens: NOT DETECTED
Staphylococcus aureus (BCID): NOT DETECTED
Staphylococcus species: NOT DETECTED
Streptococcus agalactiae: NOT DETECTED
Streptococcus pneumoniae: NOT DETECTED

## 2018-03-06 LAB — CULTURE, BLOOD (ROUTINE X 2): Special Requests: ADEQUATE

## 2018-03-07 LAB — CULTURE, BLOOD (ROUTINE X 2): Culture: NO GROWTH

## 2018-03-08 LAB — AEROBIC/ANAEROBIC CULTURE (SURGICAL/DEEP WOUND): CULTURE: NO GROWTH

## 2018-03-08 LAB — AEROBIC/ANAEROBIC CULTURE W GRAM STAIN (SURGICAL/DEEP WOUND)

## 2018-05-11 ENCOUNTER — Emergency Department (HOSPITAL_COMMUNITY): Payer: Self-pay

## 2018-05-11 ENCOUNTER — Emergency Department (HOSPITAL_COMMUNITY)
Admission: EM | Admit: 2018-05-11 | Discharge: 2018-05-11 | Discharge status: Against Medical Advice | Payer: Self-pay | Attending: Emergency Medicine | Admitting: Emergency Medicine

## 2018-05-11 ENCOUNTER — Encounter (HOSPITAL_COMMUNITY): Payer: Self-pay | Admitting: Emergency Medicine

## 2018-05-11 DIAGNOSIS — Z79899 Other long term (current) drug therapy: Secondary | ICD-10-CM | POA: Insufficient documentation

## 2018-05-11 DIAGNOSIS — J189 Pneumonia, unspecified organism: Secondary | ICD-10-CM | POA: Insufficient documentation

## 2018-05-11 DIAGNOSIS — I748 Embolism and thrombosis of other arteries: Secondary | ICD-10-CM | POA: Insufficient documentation

## 2018-05-11 DIAGNOSIS — I330 Acute and subacute infective endocarditis: Secondary | ICD-10-CM | POA: Insufficient documentation

## 2018-05-11 DIAGNOSIS — I76 Septic arterial embolism: Secondary | ICD-10-CM | POA: Insufficient documentation

## 2018-05-11 DIAGNOSIS — I309 Acute pericarditis, unspecified: Secondary | ICD-10-CM | POA: Insufficient documentation

## 2018-05-11 DIAGNOSIS — F17210 Nicotine dependence, cigarettes, uncomplicated: Secondary | ICD-10-CM | POA: Insufficient documentation

## 2018-05-11 LAB — CBC WITH DIFFERENTIAL/PLATELET
Abs Immature Granulocytes: 0.04 10*3/uL (ref 0.00–0.07)
BASOS ABS: 0 10*3/uL (ref 0.0–0.1)
BASOS PCT: 0 %
EOS ABS: 0.2 10*3/uL (ref 0.0–0.5)
Eosinophils Relative: 2 %
HCT: 39.2 % (ref 39.0–52.0)
Hemoglobin: 12.3 g/dL — ABNORMAL LOW (ref 13.0–17.0)
IMMATURE GRANULOCYTES: 0 %
Lymphocytes Relative: 13 %
Lymphs Abs: 1.6 10*3/uL (ref 0.7–4.0)
MCH: 26.3 pg (ref 26.0–34.0)
MCHC: 31.4 g/dL (ref 30.0–36.0)
MCV: 83.9 fL (ref 80.0–100.0)
MONOS PCT: 6 %
Monocytes Absolute: 0.8 10*3/uL (ref 0.1–1.0)
NRBC: 0 % (ref 0.0–0.2)
Neutro Abs: 9.7 10*3/uL — ABNORMAL HIGH (ref 1.7–7.7)
Neutrophils Relative %: 79 %
PLATELETS: 286 10*3/uL (ref 150–400)
RBC: 4.67 MIL/uL (ref 4.22–5.81)
RDW: 13.3 % (ref 11.5–15.5)
WBC: 12.4 10*3/uL — AB (ref 4.0–10.5)

## 2018-05-11 LAB — CBG MONITORING, ED: Glucose-Capillary: 126 mg/dL — ABNORMAL HIGH (ref 70–99)

## 2018-05-11 LAB — BASIC METABOLIC PANEL
Anion gap: 6 (ref 5–15)
BUN: 6 mg/dL (ref 6–20)
CO2: 26 mmol/L (ref 22–32)
Calcium: 8.5 mg/dL — ABNORMAL LOW (ref 8.9–10.3)
Chloride: 103 mmol/L (ref 98–111)
Creatinine, Ser: 0.71 mg/dL (ref 0.61–1.24)
GFR calc non Af Amer: >60 mL/min (ref >60)
Glucose, Bld: 174 mg/dL — ABNORMAL HIGH (ref 70–99)
Potassium: 4.1 mmol/L (ref 3.5–5.1)
Sodium: 135 mmol/L (ref 135–145)

## 2018-05-11 LAB — D-DIMER, QUANTITATIVE (NOT AT ARMC): D DIMER QUANT: 0.71 ug{FEU}/mL — AB (ref 0.00–0.50)

## 2018-05-11 LAB — I-STAT TROPONIN, ED: TROPONIN I, POC: 0 ng/mL (ref 0.00–0.08)

## 2018-05-11 MED ORDER — CIPROFLOXACIN HCL 500 MG PO TABS
750.0000 mg | ORAL_TABLET | Freq: Once | ORAL | Status: AC
  Administered 2018-05-11: 750 mg via ORAL
  Filled 2018-05-11: qty 2

## 2018-05-11 MED ORDER — ACETAMINOPHEN 325 MG PO TABS
650.0000 mg | ORAL_TABLET | Freq: Once | ORAL | Status: AC
  Administered 2018-05-11: 650 mg via ORAL
  Filled 2018-05-11: qty 2

## 2018-05-11 MED ORDER — CIPROFLOXACIN HCL 750 MG PO TABS: 750.0000 mg | ORAL_TABLET | Freq: Two times a day (BID) | ORAL | 1 refills | Status: DC

## 2018-05-11 MED ORDER — SODIUM CHLORIDE 0.9 % IV SOLN
INTRAVENOUS | Status: DC
  Administered 2018-05-11: 12:00:00 via INTRAVENOUS

## 2018-05-11 MED ORDER — SODIUM CHLORIDE 0.9 % IV BOLUS
1000.0000 mL | Freq: Once | INTRAVENOUS | Status: AC
  Administered 2018-05-11: 1000 mL via INTRAVENOUS

## 2018-05-11 MED ORDER — CIPROFLOXACIN HCL 750 MG PO TABS: 750.0000 mg | ORAL_TABLET | Freq: Two times a day (BID) | ORAL | 0 refills | Status: DC

## 2018-05-11 MED ORDER — IOPAMIDOL (ISOVUE-370) INJECTION 76%
INTRAVENOUS | Status: AC
  Filled 2018-05-11: qty 100

## 2018-05-11 MED ORDER — DOXYCYCLINE HYCLATE 100 MG PO TABS
100.0000 mg | ORAL_TABLET | Freq: Once | ORAL | Status: AC
  Administered 2018-05-11: 100 mg via ORAL
  Filled 2018-05-11: qty 1

## 2018-05-11 MED ORDER — IOPAMIDOL (ISOVUE-370) INJECTION 76%
100.0000 mL | Freq: Once | INTRAVENOUS | Status: AC | PRN
  Administered 2018-05-11: 100 mL via INTRAVENOUS

## 2018-05-11 NOTE — ED Notes (Signed)
AMA signed

## 2018-05-11 NOTE — ED Provider Notes (Signed)
[facility] EMERGENCY DEPARTMENT Provider Note   CSN: [id number] Arrival date & time: 05/11/18  1010     History   Chief Complaint No chief complaint on file.   HPI Brett [name] is a 37 y.o. male.  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  Patient reports that his symptoms began 2 days ago, suddenly.  He reports he first noted after injection drug use.  Patient reports he uses daily heroin, and occasional injected methamphetamine, but last used methamphetamine was a couple weeks ago.  Patient denies any vaping history.  Patient reports that it is painful to take a deep breath.  Patient denies any radiation of the pain.  Patient denies any fever or chills.  Patient denies any recent immobilization, hospitalization, hormone use, cancer treatment, hemoptysis, recent surgeries, or history of DVT/PE.  Patient is unaware of his family history.  Past Medical History:  Diagnosis Date  . Hepatitis C     Patient Active Problem List   Diagnosis Date Noted  . Cellulitis 03/02/2018  . IV drug user 03/02/2018    Past Surgical History:  Procedure Laterality Date  . I&D EXTREMITY Left 03/02/2018   Procedure: IRRIGATION AND DEBRIDEMENT EXTREMITY;  Surgeon: Bradly Bienenstock, MD;  Location: [facility];  Service: Orthopedics;  Laterality: Left;  . TONSILLECTOMY          Home Medications    Prior to Admission medications   Medication Sig Start Date End Date Taking? Authorizing Provider  doxycycline (VIBRAMYCIN) 50 MG capsule Take 1 capsule (50 mg total) by mouth 2 (two) times daily. 03/03/18   Richarda Overlie, MD  sulfamethoxazole-trimethoprim (BACTRIM DS,SEPTRA DS) 800-160 MG tablet Take 1 tablet by mouth 2 (two) times daily. 03/03/18   Richarda Overlie, MD    Family History History reviewed. No pertinent family history.  Social History Social History   Tobacco  Use  . Smoking status: Current Every Day Smoker    Packs/day: 1.00    Types: Cigarettes  . Smokeless tobacco: Never Used  Substance Use Topics  . Alcohol use: No    Frequency: Never  . Drug use: Yes    Types: Cocaine, Marijuana, IV    Comment: Heroin     Allergies   Penicillins   Review of Systems Review of Systems  Constitutional: Negative for appetite change, chills, diaphoresis, fever and unexpected weight change.  HENT: Negative for congestion and sore throat.   Eyes: Negative for visual disturbance.  Respiratory: Positive for chest tightness. Negative for cough and shortness of breath.   Cardiovascular: Positive for chest pain. Negative for palpitations and leg swelling.  Gastrointestinal: Negative for abdominal pain, nausea and vomiting.  Genitourinary: Negative for dysuria and flank pain.  Musculoskeletal: Positive for myalgias. Negative for back pain.  Skin: Negative for rash.  Neurological: Negative for dizziness, syncope, light-headedness and headaches.     Physical Exam Updated Vital Signs BP 127/75 (BP Location: Right Arm)   Pulse 87   Temp 98.5 F (36.9 C) (Oral)   Resp 19   SpO2 96%   Physical Exam  Constitutional: He appears well-developed and well-nourished. No distress.  HENT:  Head: Normocephalic and atraumatic.  Mouth/Throat: Oropharynx is clear and moist.  Eyes: Pupils are equal, round, and reactive to light. Conjunctivae and EOM are normal.  Neck: Normal range of motion. Neck supple.  Cardiovascular: Normal rate, regular rhythm, S1  normal, S2 normal and normal heart sounds.  No murmur heard. Pulmonary/Chest: Effort normal and breath sounds normal. He has no wheezes. He has no rales.  Diminished lung sounds left lung base.   Abdominal: Soft. He exhibits no distension. There is no tenderness. There is no guarding.  Musculoskeletal: Normal range of motion. He exhibits no edema or deformity.  Neurological: He is alert.  Cranial nerves grossly  intact. Patient moves extremities symmetrically and with good coordination.  Skin: Skin is warm and dry. No rash noted. No erythema.  Track marks present over bilateral arms.  Patient has a left antecubital fossa area of fluctuance but no surrounding erythema.   Psychiatric: He has a normal mood and affect. His behavior is normal. Judgment and thought content normal.  Nursing note and vitals reviewed.    ED Treatments / Results  Labs (all labs ordered are listed, but only abnormal results are displayed) Labs Reviewed  BASIC METABOLIC PANEL - Abnormal; Notable for the following components:      Result Value   Glucose, Bld 174 (*)    Calcium 8.5 (*)    All other components within normal limits  CBC WITH DIFFERENTIAL/PLATELET - Abnormal; Notable for the following components:   WBC 12.4 (*)    Hemoglobin 12.3 (*)    Neutro Abs 9.7 (*)    All other components within normal limits  D-DIMER, QUANTITATIVE (NOT AT [facility]) - Abnormal; Notable for the following components:   D-Dimer, Quant 0.71 (*)    All other components within normal limits  CBG MONITORING, ED - Abnormal; Notable for the following components:   Glucose-Capillary 126 (*)    All other components within normal limits  CULTURE, BLOOD (ROUTINE X 2)  CULTURE, BLOOD (ROUTINE X 2)  CULTURE, BLOOD (SINGLE)  I-STAT TROPONIN, ED    EKG EKG Interpretation  Date/Time:  Wednesday May 11 2018 10:21:20 EST Ventricular Rate:  87 PR Interval:    QRS Duration: 94 QT Interval:  351 QTC Calculation: 423 R Axis:   64 Text Interpretation:  Sinus rhythm Confirmed by Virgina NorfolkAdam, [place] [phone]([id number]) on 05/11/2018 10:55:52 AM   Radiology Dg Chest 2 View  Result Date: 05/11/2018 CLINICAL DATA:  Chest pain and shortness of breath EXAM: CHEST - 2 VIEW COMPARISON:  None. FINDINGS: There is airspace consolidation in the inferior lingula. The lungs elsewhere are clear. Heart size and pulmonary vascularity are normal. No adenopathy. There is  lower thoracic levoscoliosis. IMPRESSION: Inferior lingular consolidation consistent with pneumonia. Lungs elsewhere clear. No evident adenopathy. Followup PA and lateral chest radiographs recommended in 3-4 weeks following trial of antibiotic therapy to ensure resolution and exclude underlying malignancy. Electronically Signed   By: Bretta BangWilliam  Woodruff III M.D.   On: 05/11/2018 12:22    Procedures Procedures (including critical care time)  Medications Ordered in ED Medications  sodium chloride 0.9 % bolus 1,000 mL (1,000 mLs Intravenous New Bag/Given 05/11/18 1113)    And  0.9 %  sodium chloride infusion (has no administration in time range)  doxycycline (VIBRA-TABS) tablet 100 mg (has no administration in time range)  acetaminophen (TYLENOL) tablet 650 mg (650 mg Oral Given 05/11/18 1113)     Initial Impression / Assessment and Plan / ED Course  I have reviewed the triage vital signs and the nursing notes.  Pertinent labs & imaging results that were available during my care of the patient were reviewed by me and considered in my medical decision making (see chart for details).  Clinical Course as  of May 11 1805  Wed May 11, 2018  1241 Reassessed. In no distress. Pain diminished.    [AM]  1605 Likely septic emboli.   CT Angio Chest PE W/Cm &/Or Wo Cm [AM]    Clinical Course User Index [AM] Aviva Kluver B, PA-C    Initial exam, patient is nontoxic-appearing, afebrile, he dynamically stable, without tachycardia, or tachypnea.  Definitive diagnosis includes pneumonia, endocarditis, septic emboli, pulmonary embolism, pericarditis.  Patient in no acute distress during emergency department course, responded well to fluid repletion, and Tylenol for pain control.  Patient has slight leukocytosis of 12.4 with left shift.  Troponin is negative.  D-dimer is elevated to 0.71.  Chest x-ray with left lower lobe infiltrate.  Patient received CT angiogram, which demonstrated septic emboli.  This  is more consistent with patient's history, as clinically does not appear to have pneumonia.  Engaged in shared decision-making with the patient, as patient stated that he did not want to stay in the hospital, as he was not ready to withdraw from opiates, and he did not feel admission was possible.  I discussed with the patient that this is advised, as he may have endocarditis, which is only treatable by IV antibiotics.  I discussed with the patient that the risks of leaving include permanent cardiac damage, stroke, or death.  I offered to the patient to prescribe a opiate withdrawal regimen in the hospital, but he declined.  I also recommend that patient receive ultrasound for possible left AC abscess, but patient stated that he did not want any procedures.  I discussed with the patient that this might not resolve without I & D. Pt expressed full understanding.   Case was discussed with Dr. Luciana Axe of infectious disease, who states that based on patient's last culture reports which were drawn in September 2019 after a skin infection, and he grew out Pseudomonas, the best treatment course would not include high-dose ciprofloxacin for 2 months.  Patient declined drawing blood cultures today.  I appreciate Dr. Amparo Bristol involvement in the care of this patient.  This is prescribed to the patient, and stressed not to perform any contact sports or start any new workout regimens due to risk of tendon rupture.  Patient full understanding that he is leaving AGAINST MEDICAL ADVICE, and is believed to be of appropriate capacity to make this decision.  Stressed the importance of return precautions for any development of fevers, or worsening symptoms.  Patient is in understanding and agrees the plan of care.  This is a supervised visit with Dr. Virgina Norfolk. Evaluation, management, and discharge planning discussed with this attending physician.  Final Clinical Impressions(s) / ED Diagnoses   Final diagnoses:  Septic  embolism [facility])      Elisha Ponder, PA-C 05/11/18 1819    Virgina Norfolk, DO 05/11/18 1914

## 2018-05-11 NOTE — ED Notes (Signed)
Patient states he wants to leave. Patient is dressed, EDP at bedside

## 2018-05-11 NOTE — ED Triage Notes (Addendum)
Patient arrived via [facility] with chest pain X days, patient states that the pain is only when he takes a breath, PT reports IV drug use, he reports using heroin this morning.  Received 324mg  ASA from EMS.

## 2018-05-11 NOTE — ED Notes (Signed)
Patient refused blood culture draw-he states he is leaving EDP notified

## 2018-05-11 NOTE — Discharge Instructions (Addendum)
We are discharging you AGAINST MEDICAL ADVICE.  We are concerned that you have a condition that is going on the heart valves, which can lead to permanent cardiac damage, stroke, and death.  You are prescribed ciprofloxacin twice a day for 2 months.  If you cannot afford this, please pay for at least part of this.  This medication can cause tendon damage if you are doing any strenuous sports.  Please do not do any basketball, soccer, or any other strenuous sports while taking this medication.  Please return immediately for any fevers or worsening symptoms.

## 2018-05-12 ENCOUNTER — Other Ambulatory Visit: Payer: Self-pay

## 2018-05-12 ENCOUNTER — Inpatient Hospital Stay (HOSPITAL_COMMUNITY)
Admission: EM | Admit: 2018-05-12 | Discharge: 2018-05-17 | DRG: 299 | Discharge status: Against Medical Advice | Payer: Self-pay | Attending: Internal Medicine | Admitting: Internal Medicine

## 2018-05-12 ENCOUNTER — Encounter (HOSPITAL_COMMUNITY): Payer: Self-pay | Admitting: Internal Medicine

## 2018-05-12 ENCOUNTER — Emergency Department (HOSPITAL_COMMUNITY): Payer: Self-pay

## 2018-05-12 DIAGNOSIS — R7881 Bacteremia: Secondary | ICD-10-CM | POA: Diagnosis present

## 2018-05-12 DIAGNOSIS — B1920 Unspecified viral hepatitis C without hepatic coma: Secondary | ICD-10-CM | POA: Diagnosis present

## 2018-05-12 DIAGNOSIS — J189 Pneumonia, unspecified organism: Secondary | ICD-10-CM

## 2018-05-12 DIAGNOSIS — F17210 Nicotine dependence, cigarettes, uncomplicated: Secondary | ICD-10-CM | POA: Diagnosis present

## 2018-05-12 DIAGNOSIS — I76 Septic arterial embolism: Principal | ICD-10-CM | POA: Diagnosis present

## 2018-05-12 DIAGNOSIS — B9561 Methicillin susceptible Staphylococcus aureus infection as the cause of diseases classified elsewhere: Secondary | ICD-10-CM | POA: Diagnosis present

## 2018-05-12 DIAGNOSIS — M609 Myositis, unspecified: Secondary | ICD-10-CM | POA: Diagnosis present

## 2018-05-12 DIAGNOSIS — Z880 Allergy status to penicillin: Secondary | ICD-10-CM

## 2018-05-12 DIAGNOSIS — F1110 Opioid abuse, uncomplicated: Secondary | ICD-10-CM | POA: Diagnosis present

## 2018-05-12 DIAGNOSIS — A4901 Methicillin susceptible Staphylococcus aureus infection, unspecified site: Secondary | ICD-10-CM

## 2018-05-12 DIAGNOSIS — L02413 Cutaneous abscess of right upper limb: Secondary | ICD-10-CM | POA: Diagnosis present

## 2018-05-12 DIAGNOSIS — F1910 Other psychoactive substance abuse, uncomplicated: Secondary | ICD-10-CM | POA: Diagnosis present

## 2018-05-12 DIAGNOSIS — F1290 Cannabis use, unspecified, uncomplicated: Secondary | ICD-10-CM | POA: Diagnosis present

## 2018-05-12 DIAGNOSIS — L03113 Cellulitis of right upper limb: Secondary | ICD-10-CM | POA: Diagnosis present

## 2018-05-12 DIAGNOSIS — A419 Sepsis, unspecified organism: Secondary | ICD-10-CM

## 2018-05-12 DIAGNOSIS — L02419 Cutaneous abscess of limb, unspecified: Secondary | ICD-10-CM | POA: Diagnosis present

## 2018-05-12 DIAGNOSIS — B965 Pseudomonas (aeruginosa) (mallei) (pseudomallei) as the cause of diseases classified elsewhere: Secondary | ICD-10-CM | POA: Diagnosis present

## 2018-05-12 HISTORY — DX: Cutaneous abscess of limb, unspecified: L02.419

## 2018-05-12 LAB — COMPREHENSIVE METABOLIC PANEL
ALT: 30 U/L (ref 0–44)
ANION GAP: 8 (ref 5–15)
AST: 16 U/L (ref 15–41)
Albumin: 2.8 g/dL — ABNORMAL LOW (ref 3.5–5.0)
Alkaline Phosphatase: 119 U/L (ref 38–126)
BUN: 8 mg/dL (ref 6–20)
CO2: 23 mmol/L (ref 22–32)
Calcium: 8.8 mg/dL — ABNORMAL LOW (ref 8.9–10.3)
Chloride: 102 mmol/L (ref 98–111)
Creatinine, Ser: 0.72 mg/dL (ref 0.61–1.24)
Glucose, Bld: 150 mg/dL — ABNORMAL HIGH (ref 70–99)
POTASSIUM: 4.1 mmol/L (ref 3.5–5.1)
Sodium: 133 mmol/L — ABNORMAL LOW (ref 135–145)
TOTAL PROTEIN: 6.4 g/dL — AB (ref 6.5–8.1)

## 2018-05-12 LAB — CBC WITH DIFFERENTIAL/PLATELET
ABS IMMATURE GRANULOCYTES: 0.06 10*3/uL (ref 0.00–0.07)
BASOS PCT: 0 %
Basophils Absolute: 0 10*3/uL (ref 0.0–0.1)
EOS PCT: 1 %
Eosinophils Absolute: 0.1 10*3/uL (ref 0.0–0.5)
HCT: 43.7 % (ref 39.0–52.0)
Hemoglobin: 14.1 g/dL (ref 13.0–17.0)
Immature Granulocytes: 0 %
Lymphocytes Relative: 4 %
Lymphs Abs: 0.7 10*3/uL (ref 0.7–4.0)
MCH: 26.6 pg (ref 26.0–34.0)
MCHC: 32.3 g/dL (ref 30.0–36.0)
MCV: 82.5 fL (ref 80.0–100.0)
Monocytes Absolute: 0.3 10*3/uL (ref 0.1–1.0)
Monocytes Relative: 2 %
NEUTROS ABS: 13.9 10*3/uL — AB (ref 1.7–7.7)
NRBC: 0 % (ref 0.0–0.2)
Neutrophils Relative %: 93 %
PLATELETS: 284 10*3/uL (ref 150–400)
RBC: 5.3 MIL/uL (ref 4.22–5.81)
RDW: 13.4 % (ref 11.5–15.5)
WBC: 15 10*3/uL — AB (ref 4.0–10.5)

## 2018-05-12 LAB — I-STAT CG4 LACTIC ACID, ED: Lactic Acid, Venous: 1.52 mmol/L (ref 0.5–1.9)

## 2018-05-12 MED ORDER — OXYCODONE HCL 5 MG PO TABS
5.0000 mg | ORAL_TABLET | ORAL | Status: DC | PRN
  Administered 2018-05-12 – 2018-05-13 (×2): 5 mg via ORAL
  Filled 2018-05-12 (×2): qty 1

## 2018-05-12 MED ORDER — ONDANSETRON HCL 4 MG PO TABS: 4.0000 mg | ORAL_TABLET | Freq: Four times a day (QID) | ORAL | Status: DC | PRN

## 2018-05-12 MED ORDER — VANCOMYCIN HCL IN DEXTROSE 1-5 GM/200ML-% IV SOLN
1000.0000 mg | Freq: Three times a day (TID) | INTRAVENOUS | Status: DC
  Administered 2018-05-12 – 2018-05-13 (×4): 1000 mg via INTRAVENOUS
  Filled 2018-05-12 (×5): qty 200

## 2018-05-12 MED ORDER — ACETAMINOPHEN 325 MG PO TABS: 650.0000 mg | ORAL_TABLET | Freq: Four times a day (QID) | ORAL | Status: DC | PRN

## 2018-05-12 MED ORDER — SODIUM CHLORIDE 0.9 % IV SOLN
2.0000 g | Freq: Three times a day (TID) | INTRAVENOUS | Status: DC
  Administered 2018-05-12 – 2018-05-17 (×14): 2 g via INTRAVENOUS
  Filled 2018-05-12 (×15): qty 2

## 2018-05-12 MED ORDER — CLONIDINE HCL 0.1 MG PO TABS
0.1000 mg | ORAL_TABLET | Freq: Three times a day (TID) | ORAL | Status: DC | PRN
  Administered 2018-05-12 – 2018-05-16 (×6): 0.1 mg via ORAL
  Filled 2018-05-12 (×7): qty 1

## 2018-05-12 MED ORDER — ENOXAPARIN SODIUM 40 MG/0.4ML ~~LOC~~ SOLN
40.0000 mg | SUBCUTANEOUS | Status: DC
  Administered 2018-05-12 – 2018-05-13 (×2): 40 mg via SUBCUTANEOUS
  Filled 2018-05-12 (×2): qty 0.4

## 2018-05-12 MED ORDER — ACETAMINOPHEN 650 MG RE SUPP: 650.0000 mg | Freq: Four times a day (QID) | RECTAL | Status: DC | PRN

## 2018-05-12 MED ORDER — ONDANSETRON HCL 4 MG/2ML IJ SOLN: 4.0000 mg | Freq: Four times a day (QID) | INTRAMUSCULAR | Status: DC | PRN

## 2018-05-12 MED ORDER — VANCOMYCIN HCL 10 G IV SOLR
1500.0000 mg | Freq: Once | INTRAVENOUS | Status: AC
  Administered 2018-05-12: 1500 mg via INTRAVENOUS
  Filled 2018-05-12 (×2): qty 1500

## 2018-05-12 MED ORDER — SODIUM CHLORIDE 0.9 % IV SOLN
2.0000 g | Freq: Once | INTRAVENOUS | Status: AC
  Administered 2018-05-12: 2 g via INTRAVENOUS
  Filled 2018-05-12: qty 2

## 2018-05-12 MED ORDER — SODIUM CHLORIDE 0.9 % IV SOLN
INTRAVENOUS | Status: DC | PRN
  Administered 2018-05-12 – 2018-05-14 (×6): 250 mL via INTRAVENOUS

## 2018-05-12 NOTE — H&P (Signed)
History and Physical    DOA: 05/12/2018  PCP: Patient, No Pcp Per  Patient coming from: Home  Chief Complaint: Dyspnea and cough  HPI: Brett [name] is a 37 y.o. male with history h/o IV drug abuse, hepatitis C who presented to ED yesterday complaining of dyspnea and pleuritic chest pain.  Patient was evaluated in the ED and underwent CT chest showing septic emboli.  ED physician discussed with infectious diseases yesterday who recommended admission with IV vancomycin/cefepime but patient refused to stay.  Given prior history of Pseudomonas infection, he was discharged with prescription for oral ciprofloxacin.  He took 1 dose last night but presented today due to worsening symptoms and new cough, productive with yellow phlegm, no hemoptysis.  He still describes pleuritic left-sided chest pain, 5/10, nonradiating and associated with cough.  Denies any associated nausea, vomiting or palpitations.  Denies any leg swellings or headache or visual changes or pain in fingertips/toes to suggest other embolic phenomenon.  He reports last use of heroin IV last night through left arm, last use of marijuana few days back.  He states he has never been in a substance rehab center but does tend to go into withdrawals. Lab work today showed WBC of 15,000, lactate of 1.5.  Patient received 1 dose of vancomycin and cefepime in the ED.   Review of Systems: As per HPI otherwise 10 point review of systems negative.    Past Medical History:  Diagnosis Date  . Hepatitis C     Past Surgical History:  Procedure Laterality Date  . I&D EXTREMITY Left 03/02/2018   Procedure: IRRIGATION AND DEBRIDEMENT EXTREMITY;  Surgeon: Bradly Bienenstock, MD;  Location: [facility] OR;  Service: Orthopedics;  Laterality: Left;  . TONSILLECTOMY      Social history:  reports that he has been smoking cigarettes. He has been smoking about 1.00 pack per day. He has never used smokeless tobacco. He reports that he has current or past drug history.  Drugs: Heroin, Marijuana, and IV. He reports that he does not drink alcohol.   Allergies  Allergen Reactions  . Penicillins Shortness Of Breath, Itching and Rash    Has patient had a PCN reaction causing immediate rash, facial/tongue/throat swelling, SOB or lightheadedness with hypotension: Yes Has patient had a PCN reaction causing severe rash involving mucus membranes or skin necrosis: Unk Has patient had a PCN reaction that required hospitalization: Unk Has patient had a PCN reaction occurring within the last 10 years: No If all of the above answers are "NO", then may proceed with Cephalosporin use.     Family history: Patient has 2 siblings, he states he is estranged and does not know their medical history.  He states his parents are healthy as far as he knows.   Prior to Admission medications   Not on File    Physical Exam: Vitals:   05/12/18 1345 05/12/18 1428 05/12/18 1607 05/12/18 1628  BP: 126/78 137/84 120/79 121/80  Pulse: 88 78 88 83  Resp: 19 20 16 16   Temp:    98.6 F (37 C)  TempSrc:    Oral  SpO2: 96% 96% 94% 96%  Weight:      Height:        Constitutional: NAD, calm, comfortable Vitals:   05/12/18 1345 05/12/18 1428 05/12/18 1607 05/12/18 1628  BP: 126/78 137/84 120/79 121/80  Pulse: 88 78 88 83  Resp: 19 20 16 16   Temp:    98.6 F (37 C)  TempSrc:  Oral  SpO2: 96% 96% 94% 96%  Weight:      Height:       Eyes: PERRL, lids and conjunctivae normal ENMT: Mucous membranes are moist. Posterior pharynx clear of any exudate or lesions.Normal dentition.  Neck: normal, supple, no masses, no thyromegaly Respiratory: clear to auscultation bilaterally, no wheezing, no crackles. Normal respiratory effort. No accessory muscle use.  Cardiovascular: Regular rate and rhythm, no murmurs / rubs / gallops. No extremity edema. 2+ pedal pulses. No carotid bruits.  Abdomen: no tenderness, no masses palpated. No hepatosplenomegaly. Bowel sounds positive.    Musculoskeletal: no clubbing / cyanosis. No joint deformity upper and lower extremities. Good ROM, no contractures. Normal muscle tone.  Neurologic: CN 2-12 grossly intact. Sensation intact, DTR normal. Strength 5/5 in all 4.  Psychiatric: Normal judgment and insight. Alert and oriented x 3. Normal mood.  SKIN/catheters: no rashes, lesions, ulcers. No induration  Labs on Admission: I have personally reviewed following labs and imaging studies  CBC: Recent Labs  Lab 05/11/18 1158 05/12/18 1121  WBC 12.4* 15.0*  NEUTROABS 9.7* 13.9*  HGB 12.3* 14.1  HCT 39.2 43.7  MCV 83.9 82.5  PLT 286 284   Basic Metabolic Panel: Recent Labs  Lab 05/11/18 1158 05/12/18 1121  NA 135 133*  K 4.1 4.1  CL 103 102  CO2 26 23  GLUCOSE 174* 150*  BUN 6 8  CREATININE 0.71 0.72  CALCIUM 8.5* 8.8*   GFR: Estimated Creatinine Clearance: 139.2 mL/min (by C-G formula based on SCr of 0.72 mg/dL). Liver Function Tests: Recent Labs  Lab 05/12/18 1121  AST 16  ALT 30  ALKPHOS 119  BILITOT <0.1*  PROT 6.4*  ALBUMIN 2.8*   No results for input(s): LIPASE, AMYLASE in the last 168 hours. No results for input(s): AMMONIA in the last 168 hours. Coagulation Profile: No results for input(s): INR, PROTIME in the last 168 hours. Cardiac Enzymes: No results for input(s): CKTOTAL, CKMB, CKMBINDEX, TROPONINI in the last 168 hours. BNP (last 3 results) No results for input(s): PROBNP in the last 8760 hours. HbA1C: No results for input(s): HGBA1C in the last 72 hours. CBG: Recent Labs  Lab 05/11/18 1339  GLUCAP 126*   Lipid Profile: No results for input(s): CHOL, HDL, LDLCALC, TRIG, CHOLHDL, LDLDIRECT in the last 72 hours. Thyroid Function Tests: No results for input(s): TSH, T4TOTAL, FREET4, T3FREE, THYROIDAB in the last 72 hours. Anemia Panel: No results for input(s): VITAMINB12, FOLATE, FERRITIN, TIBC, IRON, RETICCTPCT in the last 72 hours. Urine analysis:    Component Value Date/Time    COLORURINE YELLOW 03/02/2018 1227   APPEARANCEUR CLOUDY (A) 03/02/2018 1227   LABSPEC 1.019 03/02/2018 1227   PHURINE 9.0 (H) 03/02/2018 1227   GLUCOSEU NEGATIVE 03/02/2018 1227   HGBUR NEGATIVE 03/02/2018 1227   BILIRUBINUR NEGATIVE 03/02/2018 1227   KETONESUR NEGATIVE 03/02/2018 1227   PROTEINUR 30 (A) 03/02/2018 1227   NITRITE NEGATIVE 03/02/2018 1227   LEUKOCYTESUR NEGATIVE 03/02/2018 1227    Radiological Exams on Admission: Dg Chest 2 View  Result Date: 05/11/2018 CLINICAL DATA:  Chest pain and shortness of breath EXAM: CHEST - 2 VIEW COMPARISON:  None. FINDINGS: There is airspace consolidation in the inferior lingula. The lungs elsewhere are clear. Heart size and pulmonary vascularity are normal. No adenopathy. There is lower thoracic levoscoliosis. IMPRESSION: Inferior lingular consolidation consistent with pneumonia. Lungs elsewhere clear. No evident adenopathy. Followup PA and lateral chest radiographs recommended in 3-4 weeks following trial of antibiotic therapy to ensure resolution  and exclude underlying malignancy. Electronically Signed   By: Bretta BangWilliam  Woodruff III M.D.   On: 05/11/2018 12:22   Ct Angio Chest Pe W/cm &/or Wo Cm  Result Date: 05/11/2018 CLINICAL DATA:  Chest pain. IV drug use. Recent heroin use. EXAM: CT ANGIOGRAPHY CHEST WITH CONTRAST TECHNIQUE: Multidetector CT imaging of the chest was performed using the standard protocol during bolus administration of intravenous contrast. Multiplanar CT image reconstructions and MIPs were obtained to evaluate the vascular anatomy. CONTRAST:  100mL ISOVUE-370 IOPAMIDOL (ISOVUE-370) INJECTION 76% COMPARISON:  Chest radiograph 05/11/2018 FINDINGS: Cardiovascular: --Pulmonary arteries: Contrast injection is sufficient to demonstrate satisfactory opacification of the pulmonary arteries to the segmental level. There is no pulmonary embolus. The main pulmonary artery is within normal limits for size. --Aorta: Limited opacification of  the aorta due to bolus timing optimization for the pulmonary arteries. Conventional 3 vessel aortic branching pattern. The aortic course and caliber are normal. There is no aortic atherosclerosis. --Heart: Normal size. No pericardial effusion. Mediastinum/Nodes: No mediastinal, hilar or axillary lymphadenopathy. The visualized thyroid and thoracic esophageal course are unremarkable. Lungs/Pleura: There are multiple scattered nodular opacities within both lungs. There is consolidation within the inferior lingula. No discrete cavitation. Upper Abdomen: Contrast bolus timing is not optimized for evaluation of the abdominal organs. Within this limitation, the visualized organs of the upper abdomen are normal. Musculoskeletal: No chest wall abnormality. No acute or significant osseous findings. Review of the MIP images confirms the above findings. IMPRESSION: 1. No pulmonary embolus. 2. Inferior lingula pneumonia. 3. Multiple scattered nodular opacities throughout both lungs, probably indicating septic emboli in the context of recent IV drug use. Electronically Signed   By: Deatra RobinsonKevin  Herman M.D.   On: 05/11/2018 16:01   Dg Chest Port 1 View  Result Date: 05/12/2018 CLINICAL DATA:  Pt seen yesterday and admitted for bacterial endocarditis r/t IV drug use. Pt left AMA yesterday and used heroin and fentanyl at 1900 last night. EXAM: PORTABLE CHEST 1 VIEW COMPARISON:  05/11/2018 chest x-ray and chest CT FINDINGS: There is persistent patchy opacity in the lingula. Mild perihilar infiltrate also noted. No pulmonary edema. IMPRESSION: Infiltrate in the lingula and LEFT perihilar region. Electronically Signed   By: Norva PavlovElizabeth  Brown M.D.   On: 05/12/2018 12:02    EKG: Independently reviewed.  Normal sinus rhythm     Assessment and Plan:   1.  Lingular pneumonia with possible septic emboli in this patient with IV drug abuse: Admit with IV vancomycin and cefepime as recommended by infectious diseases.  Send blood  cultures.   Monitor white count.Consult ID.  May need TEE if bacteremic.  Will order 2D echo for now.  2.  IV drug abuse: No signs of opiate withdrawal currently.  Will have clonidine as needed available  3.  Hepatitis C: Follow-up PCP   DVT prophylaxis: Lovenox  Code Status: Full code  Family Communication: Discussed with patient. Health care proxy would be his wife Consults called: Infectious diseases Admission status:  Patient admitted as inpatient as anticipated LOS greater than 2 midnights    Alessandra BevelsNeelima Elene Downum MD [facility] Pager [phone]  If 7PM-7AM, please contact night-coverage [email or web address] Password [email or web address]  05/12/2018, 4:35 PM

## 2018-05-12 NOTE — Progress Notes (Signed)
Patient ambulated to bed upon arrival, no complaints of pain, only complaint is diet order (heart healthy). Patient resting in room. Cardiac monitoring intiated and applied.

## 2018-05-12 NOTE — ED Notes (Signed)
Pt given food and beverage pr Dr. Lockie Molauratolo

## 2018-05-12 NOTE — ED Triage Notes (Signed)
Pt seen yesterday and admitted for bacterial endocarditis r/t IV drug use. Pt left AMA yesterday and used heroin and fentanyl at 1900 last night. Given 324 asa en route to ED via EMS d/t cp. Hx Hep C.   134/94 BP 108 HR 98% RA

## 2018-05-12 NOTE — Plan of Care (Signed)
  Problem: Education: Goal: Knowledge of General Education information will improve Description Including pain rating scale, medication(s)/side effects and non-pharmacologic comfort measures Outcome: Progressing   Problem: Health Behavior/Discharge Planning: Goal: Ability to manage health-related needs will improve Outcome: Progressing   

## 2018-05-12 NOTE — ED Notes (Signed)
IV attempted x1 without success

## 2018-05-12 NOTE — ED Provider Notes (Signed)
[facility] Provider Note   CSN: [id number] Arrival date & time: 05/12/18  1114     History   Chief Complaint Chief Complaint  Patient presents with  . Chest Pain    HPI Brett [name] is a 37 y.o. male.  The history is provided by the patient.  Chest Pain   This is a new problem. The current episode started more than 2 days ago. The problem occurs daily. The problem has not changed since onset.The pain is associated with rest. The pain is present in the lateral region and substernal region. The pain is at a severity of 3/10. The pain is mild. The quality of the pain is described as pleuritic. The pain does not radiate. Associated symptoms include cough. Pertinent negatives include no abdominal pain, no back pain, no fever, no near-syncope, no numbness, no palpitations, no PND, no shortness of breath, no syncope and no vomiting. He has tried nothing (left AMA after CT scan with septic embolic, given cipro rx) for the symptoms.  Pertinent negatives for past medical history include no CAD, no PE and no seizures. Past medical history comments: IV heroin use  Pertinent negatives for family medical history include: no CAD.    Past Medical History:  Diagnosis Date  . Hepatitis C     Patient Active Problem List   Diagnosis Date Noted  . Cellulitis 03/02/2018  . IV drug user 03/02/2018    Past Surgical History:  Procedure Laterality Date  . I&D EXTREMITY Left 03/02/2018   Procedure: IRRIGATION AND DEBRIDEMENT EXTREMITY;  Surgeon: Bradly Bienenstockrtmann, Fred, MD;  [facility];  Service: Orthopedics;  Laterality: Left;  . TONSILLECTOMY          Home Medications    Prior to Admission medications   Not on File    Family History No family history on file.  Social History Social History   Tobacco Use  . Smoking status: Current Every Day Smoker    Packs/day: 1.00    Types: Cigarettes  . Smokeless tobacco: Never Used  Substance Use Topics  .  Alcohol use: No    Frequency: Never  . Drug use: Yes    Types: Cocaine, Marijuana, IV    Comment: Heroin     Allergies   Penicillins   Review of Systems Review of Systems  Constitutional: Negative for chills and fever.  HENT: Negative for ear pain and sore throat.   Eyes: Negative for pain and visual disturbance.  Respiratory: Positive for cough. Negative for shortness of breath.   Cardiovascular: Positive for chest pain. Negative for palpitations, syncope, PND and near-syncope.  Gastrointestinal: Negative for abdominal pain and vomiting.  Genitourinary: Negative for dysuria and hematuria.  Musculoskeletal: Negative for arthralgias and back pain.  Skin: Negative for color change and rash.  Neurological: Negative for seizures, syncope and numbness.  All other systems reviewed and are negative.    Physical Exam Updated Vital Signs  ED Triage Vitals  Enc Vitals Group     BP --      Pulse Rate 05/12/18 1116 98     Resp 05/12/18 1116 15     Temp --      Temp src --      SpO2 05/12/18 1114 98 %     Weight 05/12/18 1116 175 lb (79.4 kg)     Height 05/12/18 1116 6\' 1"  (1.854 m)     Head Circumference --      Peak Flow --  Pain Score 05/12/18 1116 6     Pain Loc --      Pain Edu? --      Excl. in GC? --     Physical Exam  Constitutional: He is oriented to person, place, and time. He appears well-developed and well-nourished.  HENT:  Head: Normocephalic and atraumatic.  Eyes: Pupils are equal, round, and reactive to light. Conjunctivae and EOM are normal.  Neck: Normal range of motion. Neck supple.  Cardiovascular: Normal rate, regular rhythm, intact distal pulses and normal pulses.  No murmur heard. Pulmonary/Chest: Effort normal and breath sounds normal. No respiratory distress. He has no decreased breath sounds. He has no wheezes. He has no rales.  Abdominal: Soft. Bowel sounds are normal. There is no tenderness.  Musculoskeletal: He exhibits no edema.    Neurological: He is alert and oriented to person, place, and time.  Skin: Skin is warm and dry. Capillary refill takes less than 2 seconds.  Psychiatric: He has a normal mood and affect.  Nursing note and vitals reviewed.    ED Treatments / Results  Labs (all labs ordered are listed, but only abnormal results are displayed) Labs Reviewed  COMPREHENSIVE METABOLIC PANEL - Abnormal; Notable for the following components:      Result Value   Sodium 133 (*)    Glucose, Bld 150 (*)    Calcium 8.8 (*)    Total Protein 6.4 (*)    Albumin 2.8 (*)    Total Bilirubin <0.1 (*)    All other components within normal limits  CBC WITH DIFFERENTIAL/PLATELET - Abnormal; Notable for the following components:   WBC 15.0 (*)    Neutro Abs 13.9 (*)    All other components within normal limits  CULTURE, BLOOD (ROUTINE X 2)  CULTURE, BLOOD (ROUTINE X 2)  CULTURE, BLOOD (SINGLE)  I-STAT CG4 LACTIC ACID, ED  I-STAT CG4 LACTIC ACID, ED    EKG EKG Interpretation  Date/Time:  Thursday May 12 2018 11:23:33 EST Ventricular Rate:  99 PR Interval:    QRS Duration: 90 QT Interval:  318 QTC Calculation: 408 R Axis:   86 Text Interpretation:  Sinus rhythm Confirmed by Virgina Norfolk [phone]) on 05/12/2018 11:34:25 AM Also confirmed by Virgina Norfolk [phone]), editor Barbette Hair [phone])  on 05/12/2018 12:25:36 PM   Radiology Dg Chest 2 View  Result Date: 05/11/2018 CLINICAL DATA:  Chest pain and shortness of breath EXAM: CHEST - 2 VIEW COMPARISON:  None. FINDINGS: There is airspace consolidation in the inferior lingula. The lungs elsewhere are clear. Heart size and pulmonary vascularity are normal. No adenopathy. There is lower thoracic levoscoliosis. IMPRESSION: Inferior lingular consolidation consistent with pneumonia. Lungs elsewhere clear. No evident adenopathy. Followup PA and lateral chest radiographs recommended in 3-4 weeks following trial of antibiotic therapy to ensure resolution and  exclude underlying malignancy. Electronically Signed   By: Bretta Bang III M.D.   On: 05/11/2018 12:22   Ct Angio Chest Pe W/cm &/or Wo Cm  Result Date: 05/11/2018 CLINICAL DATA:  Chest pain. IV drug use. Recent heroin use. EXAM: CT ANGIOGRAPHY CHEST WITH CONTRAST TECHNIQUE: Multidetector CT imaging of the chest was performed using the standard protocol during bolus administration of intravenous contrast. Multiplanar CT image reconstructions and MIPs were obtained to evaluate the vascular anatomy. CONTRAST:  ISOVUE-370 IOPAMIDOL (ISOVUE-370) INJECTION 76% COMPARISON:  Chest radiograph 05/11/2018 FINDINGS: Cardiovascular: --Pulmonary arteries: Contrast injection is sufficient to demonstrate satisfactory opacification of the pulmonary arteries to the segmental level. There is  no pulmonary embolus. The main pulmonary artery is within normal limits for size. --Aorta: Limited opacification of the aorta due to bolus timing optimization for the pulmonary arteries. Conventional 3 vessel aortic branching pattern. The aortic course and caliber are normal. There is no aortic atherosclerosis. --Heart: Normal size. No pericardial effusion. Mediastinum/Nodes: No mediastinal, hilar or axillary lymphadenopathy. The visualized thyroid and thoracic esophageal course are unremarkable. Lungs/Pleura: There are multiple scattered nodular opacities within both lungs. There is consolidation within the inferior lingula. No discrete cavitation. Upper Abdomen: Contrast bolus timing is not optimized for evaluation of the abdominal organs. Within this limitation, the visualized organs of the upper abdomen are normal. Musculoskeletal: No chest wall abnormality. No acute or significant osseous findings. Review of the MIP images confirms the above findings. IMPRESSION: 1. No pulmonary embolus. 2. Inferior lingula pneumonia. 3. Multiple scattered nodular opacities throughout both lungs, probably indicating septic emboli in the  context of recent IV drug use. Electronically Signed   By: Deatra Robinson M.D.   On: 05/11/2018 16:01   Dg Chest Port 1 View  Result Date: 05/12/2018 CLINICAL DATA:  Pt seen yesterday and admitted for bacterial endocarditis r/t IV drug use. Pt left AMA yesterday and used heroin and fentanyl at 1900 last night. EXAM: PORTABLE CHEST 1 VIEW COMPARISON:  05/11/2018 chest x-ray and chest CT FINDINGS: There is persistent patchy opacity in the lingula. Mild perihilar infiltrate also noted. No pulmonary edema. IMPRESSION: Infiltrate in the lingula and LEFT perihilar region. Electronically Signed   By: Norva Pavlov M.D.   On: 05/12/2018 12:02    Procedures .Critical Care Performed by: Virgina Norfolk, DO Authorized by: Virgina Norfolk, DO   Critical care provider statement:    Critical care time (minutes):  35   Critical care was necessary to treat or prevent imminent or life-threatening deterioration of the following conditions:  Sepsis   Critical care was time spent personally by me on the following activities:  Development of treatment plan with patient or surrogate, discussions with primary provider, evaluation of patient's response to treatment, examination of patient, obtaining history from patient or surrogate, ordering and performing treatments and interventions, ordering and review of laboratory studies, ordering and review of radiographic studies, pulse oximetry, re-evaluation of patient's condition and review of old charts   I assumed direction of critical care for this patient from another provider in my specialty: no     (including critical care time)  Medications Ordered in ED Medications  vancomycin (VANCOCIN) 1,500 mg in sodium chloride 0.9 % 500 mL IVPB (has no administration in time range)  vancomycin (VANCOCIN) IVPB 1000 mg/200 mL premix (has no administration in time range)  ceFEPIme (MAXIPIME) 2 g in sodium chloride 0.9 % 100 mL IVPB (has no administration in time range)    ceFEPIme (MAXIPIME) 2 g in sodium chloride 0.9 % 100 mL IVPB (2 g Intravenous New Bag/Given 05/12/18 1223)     Initial Impression / Assessment and Plan / ED Course  I have reviewed the triage vital signs and the nursing notes.  Pertinent labs & imaging results that were available during my care of the patient were reviewed by me and considered in my medical decision making (see chart for details).     Brett [name] is a 37 year old male with history of IV drug abuse who presents to the ED with chest pain.  Patient with normal vitals except mild tachycardia. No fever.  EKG upon arrival reassuring.  No ischemic changes.  Patient was seen  here yesterday and left AGAINST MEDICAL ADVICE after having a CT scan that showed septic emboli and concerning for possible endocarditis.  Patient was given a dose of oral ciprofloxacin at that time and after discussion with infectious disease patient was prescribed long-term ciprofloxacin which he states he did not use.  Patient states that he changed his mind and would like to stay for treatment.  Clear breath sounds on exam.  Overall well-appearing.  Continues to have intermittent chest pain, cough, sputum production.  Infectious labs were ordered including 3 blood cultures.  Patient empirically started on IV vancomycin IV cefepime.  Patient with leukocytosis but otherwise no significant anemia, electrolyte abnormality, kidney injury.  Chest x-ray shows left-sided pneumonia but no obvious major pleural effusions.  Patient states he has possibly a history of hep C otherwise.  Patient admitted to medicine service for further care.  Will likely need echocardiogram, organisms to grow out on blood culture.  Remained hemodynamically stable without my care.  Lactic acid was normal and no concern for septic shock.  This chart was dictated using voice recognition software.  Despite best efforts to proofread,  errors can occur which can change the documentation  meaning.   Final Clinical Impressions(s) / ED Diagnoses   Final diagnoses:  Sepsis, due to unspecified organism, unspecified whether acute organ dysfunction present [facility])  Septic embolism [facility])    ED Discharge Orders    None       Virgina Norfolk, DO 05/12/18 1303

## 2018-05-12 NOTE — Progress Notes (Signed)
Pharmacy Antibiotic Note  Brett [name] is a 37 y.o. male admitted on 05/12/2018 with bacteremia.  Pharmacy has been consulted for Vancomycin and Cefepime dosing.  Plan: Vancomycin 1500 mg IV x 1, then 1000 mg IV q8hr Cefepime 2 grams IV q8hr Will monitor renal function, cultures and sensitivities Will monitor vancomycin trough levels as appropriate  Height: 6\' 1"  (185.4 cm) Weight: 175 lb (79.4 kg) IBW/kg (Calculated) : 79.9  Temp (24hrs), Avg:97.9 F (36.6 C), Min:97.9 F (36.6 C), Max:97.9 F (36.6 C)  Recent Labs  Lab 05/11/18 1158 05/12/18 1159  WBC 12.4*  --   CREATININE 0.71  --   LATICACIDVEN  --  1.52    Estimated Creatinine Clearance: 139.2 mL/min (by C-G formula based on SCr of 0.71 mg/dL).    Allergies  Allergen Reactions  . Penicillins Shortness Of Breath, Itching and Rash    Has patient had a PCN reaction causing immediate rash, facial/tongue/throat swelling, SOB or lightheadedness with hypotension: Yes Has patient had a PCN reaction causing severe rash involving mucus membranes or skin necrosis: Unk Has patient had a PCN reaction that required hospitalization: Unk Has patient had a PCN reaction occurring within the last 10 years: No If all of the above answers are "NO", then may proceed with Cephalosporin use.     Antimicrobials this admission: 11/14 Vanc >>  11/14 Cefepime >>  Thank you for allowing pharmacy to be a part of this patient's care.  Jeanella Caraathy Mahogani Holohan, PharmD, [facility] Clinical Pharmacist Please see AMION for all Pharmacists' Contact Phone Numbers 05/12/2018, 12:11 PM

## 2018-05-13 ENCOUNTER — Inpatient Hospital Stay (HOSPITAL_COMMUNITY): Payer: Self-pay

## 2018-05-13 DIAGNOSIS — R079 Chest pain, unspecified: Secondary | ICD-10-CM

## 2018-05-13 DIAGNOSIS — I76 Septic arterial embolism: Principal | ICD-10-CM

## 2018-05-13 DIAGNOSIS — A419 Sepsis, unspecified organism: Secondary | ICD-10-CM

## 2018-05-13 LAB — CBC WITH DIFFERENTIAL/PLATELET
Abs Immature Granulocytes: 0.07 10*3/uL (ref 0.00–0.07)
Basophils Absolute: 0 10*3/uL (ref 0.0–0.1)
Basophils Relative: 0 %
Eosinophils Absolute: 0.4 10*3/uL (ref 0.0–0.5)
Eosinophils Relative: 3 %
HCT: 38.8 % — ABNORMAL LOW (ref 39.0–52.0)
Hemoglobin: 12.7 g/dL — ABNORMAL LOW (ref 13.0–17.0)
Immature Granulocytes: 1 %
Lymphocytes Relative: 24 %
Lymphs Abs: 2.9 10*3/uL (ref 0.7–4.0)
MCH: 26.3 pg (ref 26.0–34.0)
MCHC: 32.7 g/dL (ref 30.0–36.0)
MCV: 80.3 fL (ref 80.0–100.0)
Monocytes Absolute: 1 10*3/uL (ref 0.1–1.0)
Monocytes Relative: 9 %
Neutro Abs: 7.5 10*3/uL (ref 1.7–7.7)
Neutrophils Relative %: 63 %
Platelets: 347 10*3/uL (ref 150–400)
RBC: 4.83 MIL/uL (ref 4.22–5.81)
RDW: 13.3 % (ref 11.5–15.5)
WBC: 11.8 10*3/uL — ABNORMAL HIGH (ref 4.0–10.5)
nRBC: 0 % (ref 0.0–0.2)

## 2018-05-13 LAB — COMPREHENSIVE METABOLIC PANEL
ALT: 22 U/L (ref 0–44)
AST: 14 U/L — ABNORMAL LOW (ref 15–41)
Albumin: 2.5 g/dL — ABNORMAL LOW (ref 3.5–5.0)
Alkaline Phosphatase: 93 U/L (ref 38–126)
Anion gap: 9 (ref 5–15)
BUN: 5 mg/dL — ABNORMAL LOW (ref 6–20)
CO2: 22 mmol/L (ref 22–32)
Calcium: 8.4 mg/dL — ABNORMAL LOW (ref 8.9–10.3)
Chloride: 106 mmol/L (ref 98–111)
Creatinine, Ser: 0.88 mg/dL (ref 0.61–1.24)
GFR calc Af Amer: >60 mL/min (ref >60)
GFR calc non Af Amer: >60 mL/min (ref >60)
Glucose, Bld: 166 mg/dL — ABNORMAL HIGH (ref 70–99)
Potassium: 3.9 mmol/L (ref 3.5–5.1)
Sodium: 137 mmol/L (ref 135–145)
Total Bilirubin: 0.3 mg/dL (ref 0.3–1.2)
Total Protein: 6.1 g/dL — ABNORMAL LOW (ref 6.5–8.1)

## 2018-05-13 LAB — VANCOMYCIN, TROUGH: Vancomycin Tr: 12 ug/mL — ABNORMAL LOW (ref 15–20)

## 2018-05-13 LAB — ECHOCARDIOGRAM COMPLETE
HEIGHTINCHES: 73 in
WEIGHTICAEL: 2745.6 [oz_av]

## 2018-05-13 LAB — MAGNESIUM: Magnesium: 2 mg/dL (ref 1.7–2.4)

## 2018-05-13 MED ORDER — NICOTINE 21 MG/24HR TD PT24
21.0000 mg | MEDICATED_PATCH | Freq: Every day | TRANSDERMAL | Status: DC
  Administered 2018-05-13 – 2018-05-17 (×5): 21 mg via TRANSDERMAL
  Filled 2018-05-13 (×5): qty 1

## 2018-05-13 MED ORDER — OXYCODONE HCL 5 MG PO TABS
10.0000 mg | ORAL_TABLET | ORAL | Status: DC | PRN
  Administered 2018-05-13 – 2018-05-17 (×14): 10 mg via ORAL
  Filled 2018-05-13 (×15): qty 2

## 2018-05-13 MED ORDER — VANCOMYCIN HCL 10 G IV SOLR
1250.0000 mg | Freq: Three times a day (TID) | INTRAVENOUS | Status: DC
  Administered 2018-05-14 – 2018-05-17 (×8): 1250 mg via INTRAVENOUS
  Filled 2018-05-13 (×13): qty 1250

## 2018-05-13 NOTE — Consult Note (Signed)
[facility]    Date of Admission:  05/12/2018     Total days of antibiotics 1               Reason for Consult: Septic Emboli   Referring Provider: [facility] Primary Care Provider: Patient, No Pcp Per   Assessment/Plan:  Mr. Rabideau is a 37 year old male with previous history of Pseudomonas aeruginosa bacteremia in September 2019 with current IV heroin now having left sided chest pain with CT scan showing septic emboli. TTE is pending. He has been afebrile since admission. Likely source of infection is endocarditis. Cultures have been without growth to date, however he had been given ciprofloxacin prior to the cultures being drawn during previous ED visit. He is not a candidate for PICC line and have discussed importance of treating with IV antibiotics for most effective treatment. Risks of further septic emboli discussed. He is on Day 2 of antimicrobial therapy with vancomycin and Cefepime. He is currently amenable to staying for treatment. Would be ideal for him to remain in the hospital for treatment, however if he should decide to leave ciprofloxacin would be the only oral option, although less ideal. Mr. Buresh has had previous success with Methadone and may benefit from restarting.  1. Continue current dose of Cefepime and vancomycin. 2. Await TTE results and will likely need TEE. 3. Will check Hepatitis C RNA levels and if present will consider outpatient treatment. 4. Opoid use disorder per primary team. 5. Continue to monitor fever curve, WBC count and cultures.    Active Problems:   IV drug abuse (HCC)   Septic embolism (HCC)   . enoxaparin (LOVENOX) injection  40 mg Subcutaneous Q24H  . nicotine  21 mg Transdermal Daily     HPI: Trevelle Mcgurn is a 37 y.o. male with previous medical history of IV drug abuse, hepatitis C, and pseudomonal bacteremia who initially presented to the ED on 05/11/2018 with a chief complaint of left-sided chest pain  of 2-day duration starting after injection drug use.  Primarily uses heroin daily and has occasionally injected methamphetamine.  Chest x-ray with concern for pneumonia with follow-up CT scan of the chest with multiple scattered nodular opacities throughout both lungs with most likely source being septic emboli.  Based on ciprofloxacin and declined additional blood work leaving AGAINST MEDICAL ADVICE.  Mr. Mcclenny return to the ED on 11/14 /19 with continued chest pain.  White blood cell count found to be 15.  He was admitted and started on vancomycin and cefepime.  Afebrile since admission with stable leukocytosis. TTE has been ordered. Blood cultures are without growth to date, however he was taking ciprofloxacin prior to cultures. Previous HIV testing negative.   Interval History:  Mr. Lupton has been having chest pain and shortness of breath when taking deep breaths for about 4 days prior to admission. He remains an active heroin user. Uses clean needles with each injection and has not shared. Does not clean off his skin prior to injection. Has had no fevers, chills, or sweats recently. Has previous experience with methadone and was sober for 4 years. Has also used Suboxone in the past, however did not like it because if he missed a dose he would go into withdrawal.    Review of Systems: Review of Systems  Constitutional: Negative for chills and fever.  Eyes: Negative for blurred vision, double vision and photophobia.  Respiratory: Positive for shortness of breath. Negative for cough,  sputum production and wheezing.   Cardiovascular: Positive for chest pain. Negative for leg swelling.  Gastrointestinal: Negative for abdominal pain, constipation, diarrhea, nausea and vomiting.  Skin: Negative for rash.  Neurological: Negative for weakness and headaches.     Past Medical History:  Diagnosis Date  . Hepatitis C     Social History   Tobacco Use  . Smoking status: Current Every Day  Smoker    Packs/day: 1.00    Types: Cigarettes  . Smokeless tobacco: Never Used  Substance Use Topics  . Alcohol use: No    Frequency: Never  . Drug use: Yes    Types: Cocaine, Marijuana, IV    Comment: Heroin    No family history on file.  Allergies  Allergen Reactions  . Penicillins Shortness Of Breath, Itching and Rash    Has patient had a PCN reaction causing immediate rash, facial/tongue/throat swelling, SOB or lightheadedness with hypotension: Yes Has patient had a PCN reaction causing severe rash involving mucus membranes or skin necrosis: Unk Has patient had a PCN reaction that required hospitalization: Unk Has patient had a PCN reaction occurring within the last 10 years: No If all of the above answers are "NO", then may proceed with Cephalosporin use.     OBJECTIVE: Blood pressure 133/76, pulse 72, temperature 98.2 F (36.8 C), temperature source Oral, resp. rate 20, height 6\' 1"  (1.854 m), weight 77.8 kg, SpO2 97 %.  Physical Exam  Constitutional: He is oriented to person, place, and time. He appears well-developed and well-nourished. No distress.  Cardiovascular: Normal rate, regular rhythm, normal heart sounds and intact distal pulses. Exam reveals no gallop and no friction rub.  No murmur heard. Pulmonary/Chest: Effort normal and breath sounds normal. No stridor. No respiratory distress. He has no wheezes. He has no rales.  Abdominal: Soft. Bowel sounds are normal. He exhibits no distension. There is no tenderness.  Neurological: He is alert and oriented to person, place, and time.  Skin: Skin is warm and dry. No rash noted.  Psychiatric: He has a normal mood and affect.    Lab Results Lab Results  Component Value Date   WBC 11.8 (H) 05/13/2018   HGB 12.7 (L) 05/13/2018   HCT 38.8 (L) 05/13/2018   MCV 80.3 05/13/2018   PLT 347 05/13/2018    Lab Results  Component Value Date   CREATININE 0.88 05/13/2018   BUN 5 (L) 05/13/2018   NA 137 05/13/2018   K  3.9 05/13/2018   CL 106 05/13/2018   CO2 22 05/13/2018    Lab Results  Component Value Date   ALT 22 05/13/2018   AST 14 (L) 05/13/2018   ALKPHOS 93 05/13/2018   BILITOT 0.3 05/13/2018     Microbiology: Recent Results (from the past 240 hour(s))  Blood culture (routine single)     Status: None (Preliminary result)   Collection Time: 05/12/18 11:39 AM  Result Value Ref Range Status   Specimen Description BLOOD RIGHT ARM  Final   Special Requests   Final    BOTTLES DRAWN AEROBIC AND ANAEROBIC Blood Culture adequate volume   Culture   Final    NO GROWTH < 24 HOURS Performed at [facility], [address], [place], [place] [address]    Report Status PENDING  Incomplete  Blood Culture (routine x 2)     Status: None (Preliminary result)   Collection Time: 05/12/18 12:05 PM  Result Value Ref Range Status   Specimen Description BLOOD LEFT  ARM  Final   Special Requests NONE  Final   Culture   Final    NO GROWTH < 24 HOURS Performed at [facility], [address] [place], [place] [address]    Report Status PENDING  Incomplete     Marcos EkeGreg Tawnia Schirm, NP [facility] [phone] Pager  05/13/2018  9:52 AM

## 2018-05-13 NOTE — Plan of Care (Signed)

## 2018-05-13 NOTE — Progress Notes (Addendum)
Patient found smoking in room. Patient educated on not smoking. Patient requesting nicotine patch. Bodenheimer NP notified of patient;s request. Will continue to monitor patient.

## 2018-05-13 NOTE — Progress Notes (Signed)
Pharmacy Antibiotic Note  Brett [name] is a 37 y.o. male admitted on 05/12/2018 with bacteremia.  Pharmacy has been consulted for Vancomycin and Cefepime dosing.  Vancomycin trough this evening = 12.  Goal 15-20.  Plan: Increase vancomycin to 1250 mg q 8 hrs. Continue cefepime 2 grams IV q8hr Will monitor renal function, cultures and sensitivities Will monitor vancomycin trough levels as appropriate  Height: 6\' 1"  (185.4 cm) Weight: 171 lb 9.6 oz (77.8 kg) IBW/kg (Calculated) : 79.9  Temp (24hrs), Avg:98.4 F (36.9 C), Min:98.2 F (36.8 C), Max:98.8 F (37.1 C)  Recent Labs  Lab 05/11/18 1158 05/12/18 1121 05/12/18 1159 05/13/18 0554 05/13/18 1913  WBC 12.4* 15.0*  --  11.8*  --   CREATININE 0.71 0.72  --  0.88  --   LATICACIDVEN  --   --  1.52  --   --   VANCOTROUGH  --   --   --   --  12*    Estimated Creatinine Clearance: 124 mL/min (by C-G formula based on SCr of 0.88 mg/dL).    Allergies  Allergen Reactions  . Penicillins Shortness Of Breath, Itching and Rash    Has patient had a PCN reaction causing immediate rash, facial/tongue/throat swelling, SOB or lightheadedness with hypotension: Yes Has patient had a PCN reaction causing severe rash involving mucus membranes or skin necrosis: Unk Has patient had a PCN reaction that required hospitalization: Unk Has patient had a PCN reaction occurring within the last 10 years: No If all of the above answers are "NO", then may proceed with Cephalosporin use.     Antimicrobials this admission: 11/14 Vanc >>  11/14 Cefepime >>  Cultures: Bcx 11/14: ngtd   Thank you for allowing pharmacy to be a part of this patient's care.  Jenetta DownerJessica Artavis Cowie, Pharm D, BCPS, [facility] Clinical Pharmacist Phone [phone]  05/13/2018 8:36 PM

## 2018-05-13 NOTE — Progress Notes (Signed)
  Echocardiogram 2D Echocardiogram has been performed.  Fleming Prill G Jonne Rote 05/13/2018, 10:56 AM

## 2018-05-13 NOTE — Progress Notes (Signed)
Pt came to MRI explained procedure and duration of exam. Pt sts he did not want contrast pt sts he felt it was unnecessary so contrast was not administered.

## 2018-05-13 NOTE — Care Management Note (Signed)
Case Management Note  Patient Details  Name: Brett [name] MRN: [id number] Date of Birth: 11/11/1980  Subjective/Objective:   Septic Embolism                Action/Plan: [facility], pt can only use this program once a year. No PCP, no medical insurance; At discharge, he can [facility]; history of IVDA; SW to see; CM will continue to follow for progression of care.  Expected Discharge Date:     Undetermined at this time             Expected Discharge Plan:  Home/Self Care  In-House Referral:   Financial Counselor  Discharge planning Services  CM Consult   Status of Service:  In process, will continue to follow  Reola MosherChandler, Caeley Dohrmann L, RN,MHA,BSN [phone] 05/13/2018, 2:33 PM

## 2018-05-13 NOTE — Plan of Care (Signed)
  Problem: Education: Goal: Knowledge of General Education information will improve Description: Including pain rating scale, medication(s)/side effects and non-pharmacologic comfort measures Outcome: Progressing   Problem: Clinical Measurements: Goal: Ability to maintain clinical measurements within normal limits will improve Outcome: Progressing   Problem: Activity: Goal: Risk for activity intolerance will decrease Outcome: Progressing   Problem: Coping: Goal: Level of anxiety will decrease Outcome: Progressing   Problem: Safety: Goal: Ability to remain free from injury will improve Outcome: Progressing   Problem: Skin Integrity: Goal: Risk for impaired skin integrity will decrease Outcome: Progressing   

## 2018-05-13 NOTE — Progress Notes (Signed)
PROGRESS NOTE  Brett [name] KGM:[id number] DOB: 02-15-1981 DOA: 05/12/2018 PCP: Patient, No Pcp Per   LOS: 1 day   Brief Narrative / Interim history: 37 year old male with history of IV drug abuse, hep C, who was admitted to the hospital on 11/14 with dyspnea and pleuritic type chest pain.  CT scan on admission showed possible septic emboli.  He was admitted to the hospital, placed on IV Vanco and cefepime and ID was consulted.  Of note, he was evaluated in the ED in September and at that time cultures showed Pseudomonas bacteremia for which she was never fully treated.  He left AMA at one point and was on ciprofloxacin based on Pseudomonas sensitivities but it is not clear how consistently patient took this.  Subjective: He is complaining of pleuritic type chest pain, states that clonidine helps him well with his withdrawal symptoms.  He denies any shortness of breath, denies any abdominal pain, nausea or vomiting.  Assessment & Plan: Active Problems:   IV drug abuse (HCC)   Septic embolism (HCC)   Lingular pneumonia with possible septic emboli -ID consulted, discussed with Dr. Luciana Axe, continue Vanco and cefepime.  Cultures are no growth but given previous bacteremia with Pseudomonas and the fact that he has been intermittently on antibiotics as an outpatient may not yield results.  2D echo pending, if negative will need a TEE  IV drug use -Continue clonidine as it worked for the patient's withdrawal symptoms in the past, will give oxy intermittently for pain.  Avoid IV narcotics.  Consulted Child psychotherapist as patient has the desire to quit drugs  Hep C -ID following  Right elbow swelling -He states that the right elbow has been swollen for the past 6 days, this is close to where he injected drugs.  Denies any significant pain however reports decreased range of motion.  Will obtain an MRI to further characterize    Scheduled Meds: . enoxaparin (LOVENOX) injection  40 mg Subcutaneous  Q24H  . nicotine  21 mg Transdermal Daily   Continuous Infusions: . sodium chloride 250 mL (05/13/18 0511)  . ceFEPime (MAXIPIME) IV 2 g (05/13/18 1350)  . vancomycin 1,000 mg (05/13/18 1131)   PRN Meds:.sodium chloride, acetaminophen **OR** acetaminophen, cloNIDine, ondansetron **OR** ondansetron (ZOFRAN) IV, oxyCODONE  DVT prophylaxis: Lovenox Code Status: Full code Family Communication: Girlfriend at bedside Disposition Plan: To be determined  Consultants:   ID  Procedures:   2D echo: pending  Antimicrobials:  Vancomycin 11/14 >>  Cefepime 11/14 >>   Objective: Vitals:   05/12/18 2005 05/13/18 0102 05/13/18 0441 05/13/18 1222  BP: 127/76 115/82 133/76 119/81  Pulse: 73 87 72 88  Resp: 20 20 20 20   Temp: 97.7 F (36.5 C) 98.2 F (36.8 C) 98.2 F (36.8 C) 98.3 F (36.8 C)  TempSrc: Oral Oral Oral Oral  SpO2: 98% 98% 97% 97%  Weight:   77.8 kg   Height:        Intake/Output Summary (Last 24 hours) at 05/13/2018 1359 Last data filed at 05/13/2018 1337 Gross per 24 hour  Intake 2300.26 ml  Output 2100 ml  Net 200.26 ml   Filed Weights   05/12/18 1116 05/12/18 1628 05/13/18 0441  Weight: 79.4 kg 77.9 kg 77.8 kg    Examination:  Constitutional: NAD Eyes: lids and conjunctivae normal ENMT: Mucous membranes are moist. No oropharyngeal exudates Neck: normal, supple, no masses, no thyromegaly Respiratory: clear to auscultation bilaterally, no wheezing, no crackles. Normal respiratory effort. No accessory muscle  use.  Cardiovascular: Regular rate and rhythm, no murmurs / rubs / gallops. No LE edema. 2+ pedal pulses. No carotid bruits.  Abdomen: no tenderness. Bowel sounds positive.  Musculoskeletal: no clubbing / cyanosis.  Right elbow appears swollen, no erythema noted.  An area of induration is palpated on the medial aspect Skin: no rashes.  Needle marks on both AC fossas on upper extremities Neurologic: CN 2-12 grossly intact. Strength 5/5 in all 4.    Psychiatric: Normal judgment and insight. Alert and oriented x 3. Normal mood.    Data Reviewed: I have independently reviewed following labs and imaging studies  CBC: Recent Labs  Lab 05/11/18 1158 05/12/18 1121 05/13/18 0554  WBC 12.4* 15.0* 11.8*  NEUTROABS 9.7* 13.9* 7.5  HGB 12.3* 14.1 12.7*  HCT 39.2 43.7 38.8*  MCV 83.9 82.5 80.3  PLT 286 284 347   Basic Metabolic Panel: Recent Labs  Lab 05/11/18 1158 05/12/18 1121 05/13/18 0554  NA 135 133* 137  K 4.1 4.1 3.9  CL 103 102 106  CO2 26 23 22   GLUCOSE 174* 150* 166*  BUN 6 8 5*  CREATININE 0.71 0.72 0.88  CALCIUM 8.5* 8.8* 8.4*  MG  --   --  2.0   GFR: Estimated Creatinine Clearance: 124 mL/min (by C-G formula based on SCr of 0.88 mg/dL). Liver Function Tests: Recent Labs  Lab 05/12/18 1121 05/13/18 0554  AST 16 14*  ALT 30 22  ALKPHOS 119 93  BILITOT <0.1* 0.3  PROT 6.4* 6.1*  ALBUMIN 2.8* 2.5*   No results for input(s): LIPASE, AMYLASE in the last 168 hours. No results for input(s): AMMONIA in the last 168 hours. Coagulation Profile: No results for input(s): INR, PROTIME in the last 168 hours. Cardiac Enzymes: No results for input(s): CKTOTAL, CKMB, CKMBINDEX, TROPONINI in the last 168 hours. BNP (last 3 results) No results for input(s): PROBNP in the last 8760 hours. HbA1C: No results for input(s): HGBA1C in the last 72 hours. CBG: Recent Labs  Lab 05/11/18 1339  GLUCAP 126*   Lipid Profile: No results for input(s): CHOL, HDL, LDLCALC, TRIG, CHOLHDL, LDLDIRECT in the last 72 hours. Thyroid Function Tests: No results for input(s): TSH, T4TOTAL, FREET4, T3FREE, THYROIDAB in the last 72 hours. Anemia Panel: No results for input(s): VITAMINB12, FOLATE, FERRITIN, TIBC, IRON, RETICCTPCT in the last 72 hours. Urine analysis:    Component Value Date/Time   COLORURINE YELLOW 03/02/2018 1227   APPEARANCEUR CLOUDY (A) 03/02/2018 1227   LABSPEC 1.019 03/02/2018 1227   PHURINE 9.0 (H) 03/02/2018  1227   GLUCOSEU NEGATIVE 03/02/2018 1227   HGBUR NEGATIVE 03/02/2018 1227   BILIRUBINUR NEGATIVE 03/02/2018 1227   KETONESUR NEGATIVE 03/02/2018 1227   PROTEINUR 30 (A) 03/02/2018 1227   NITRITE NEGATIVE 03/02/2018 1227   LEUKOCYTESUR NEGATIVE 03/02/2018 1227   Sepsis Labs: Invalid input(s): PROCALCITONIN, LACTICIDVEN  Recent Results (from the past 240 hour(s))  Blood culture (routine single)     Status: None (Preliminary result)   Collection Time: 05/12/18 11:39 AM  Result Value Ref Range Status   Specimen Description BLOOD RIGHT ARM  Final   Special Requests   Final    BOTTLES DRAWN AEROBIC AND ANAEROBIC Blood Culture adequate volume   Culture   Final    NO GROWTH < 24 HOURS Performed at [facility], [address] [place], [place] [address]    Report Status PENDING  Incomplete  Blood Culture (routine x 2)     Status: None (Preliminary result)  Collection Time: 05/12/18 12:05 PM  Result Value Ref Range Status   Specimen Description BLOOD LEFT ARM  Final   Special Requests NONE  Final   Culture   Final    NO GROWTH < 24 HOURS Performed at [facility], [address], [place], [place] [address]    Report Status PENDING  Incomplete      Radiology Studies: Ct Angio Chest Pe W/cm &/or Wo Cm  Result Date: 05/11/2018 CLINICAL DATA:  Chest pain. IV drug use. Recent heroin use. EXAM: CT ANGIOGRAPHY CHEST WITH CONTRAST TECHNIQUE: Multidetector CT imaging of the chest was performed using the standard protocol during bolus administration of intravenous contrast. Multiplanar CT image reconstructions and MIPs were obtained to evaluate the vascular anatomy. CONTRAST:  ISOVUE-370 IOPAMIDOL (ISOVUE-370) INJECTION 76% COMPARISON:  Chest radiograph 05/11/2018 FINDINGS: Cardiovascular: --Pulmonary arteries: Contrast injection is sufficient to demonstrate satisfactory opacification of the pulmonary arteries to the segmental level. There is no pulmonary embolus. The main  pulmonary artery is within normal limits for size. --Aorta: Limited opacification of the aorta due to bolus timing optimization for the pulmonary arteries. Conventional 3 vessel aortic branching pattern. The aortic course and caliber are normal. There is no aortic atherosclerosis. --Heart: Normal size. No pericardial effusion. Mediastinum/Nodes: No mediastinal, hilar or axillary lymphadenopathy. The visualized thyroid and thoracic esophageal course are unremarkable. Lungs/Pleura: There are multiple scattered nodular opacities within both lungs. There is consolidation within the inferior lingula. No discrete cavitation. Upper Abdomen: Contrast bolus timing is not optimized for evaluation of the abdominal organs. Within this limitation, the visualized organs of the upper abdomen are normal. Musculoskeletal: No chest wall abnormality. No acute or significant osseous findings. Review of the MIP images confirms the above findings. IMPRESSION: 1. No pulmonary embolus. 2. Inferior lingula pneumonia. 3. Multiple scattered nodular opacities throughout both lungs, probably indicating septic emboli in the context of recent IV drug use. Electronically Signed   By: Deatra Robinson M.D.   On: 05/11/2018 16:01   Dg Chest Port 1 View  Result Date: 05/12/2018 CLINICAL DATA:  Pt seen yesterday and admitted for bacterial endocarditis r/t IV drug use. Pt left AMA yesterday and used heroin and fentanyl at 1900 last night. EXAM: PORTABLE CHEST 1 VIEW COMPARISON:  05/11/2018 chest x-ray and chest CT FINDINGS: There is persistent patchy opacity in the lingula. Mild perihilar infiltrate also noted. No pulmonary edema. IMPRESSION: Infiltrate in the lingula and LEFT perihilar region. Electronically Signed   By: Norva Pavlov M.D.   On: 05/12/2018 12:02    Pamella Pert, MD, PhD [facility] Pager [phone]  If 7PM-7AM, please contact night-coverage [email or web address] Password [email or web address] 05/13/2018, 1:59 PM

## 2018-05-14 ENCOUNTER — Inpatient Hospital Stay (HOSPITAL_COMMUNITY): Payer: Self-pay | Admitting: Certified Registered"

## 2018-05-14 ENCOUNTER — Encounter (HOSPITAL_COMMUNITY): Payer: Self-pay | Admitting: Anesthesiology

## 2018-05-14 ENCOUNTER — Encounter (HOSPITAL_COMMUNITY)
Admission: EM | Discharge status: Against Medical Advice | Payer: Self-pay | Source: Home / Self Care | Attending: Internal Medicine

## 2018-05-14 HISTORY — PX: I & D EXTREMITY: SHX5045

## 2018-05-14 SURGERY — IRRIGATION AND DEBRIDEMENT EXTREMITY
Anesthesia: General | Site: Arm Upper | Laterality: Right

## 2018-05-14 MED ORDER — FENTANYL CITRATE (PF) 100 MCG/2ML IJ SOLN
INTRAMUSCULAR | Status: DC | PRN
  Administered 2018-05-14 (×4): 50 ug via INTRAVENOUS

## 2018-05-14 MED ORDER — LIDOCAINE 2% (20 MG/ML) 5 ML SYRINGE
INTRAMUSCULAR | Status: AC
  Filled 2018-05-14: qty 5

## 2018-05-14 MED ORDER — PROPOFOL 10 MG/ML IV BOLUS
INTRAVENOUS | Status: AC
  Filled 2018-05-14: qty 20

## 2018-05-14 MED ORDER — DEXMEDETOMIDINE HCL IN NACL 200 MCG/50ML IV SOLN
INTRAVENOUS | Status: DC | PRN
  Administered 2018-05-14 (×5): 8 ug via INTRAVENOUS

## 2018-05-14 MED ORDER — 0.9 % SODIUM CHLORIDE (POUR BTL) OPTIME
TOPICAL | Status: DC | PRN
  Administered 2018-05-14 (×2): 1000 mL

## 2018-05-14 MED ORDER — LACTATED RINGERS IV SOLN: INTRAVENOUS | Status: DC

## 2018-05-14 MED ORDER — PHENYLEPHRINE 40 MCG/ML (10ML) SYRINGE FOR IV PUSH (FOR BLOOD PRESSURE SUPPORT)
PREFILLED_SYRINGE | INTRAVENOUS | Status: AC
  Filled 2018-05-14: qty 10

## 2018-05-14 MED ORDER — PHENYLEPHRINE HCL 10 MG/ML IJ SOLN
INTRAMUSCULAR | Status: DC | PRN
  Administered 2018-05-14 (×2): 120 ug via INTRAVENOUS

## 2018-05-14 MED ORDER — HYDROMORPHONE HCL 1 MG/ML IJ SOLN
INTRAMUSCULAR | Status: AC
  Administered 2018-05-14: 0.5 mg via INTRAVENOUS
  Filled 2018-05-14: qty 1

## 2018-05-14 MED ORDER — MIDAZOLAM HCL 2 MG/2ML IJ SOLN
INTRAMUSCULAR | Status: AC
  Filled 2018-05-14: qty 2

## 2018-05-14 MED ORDER — EPHEDRINE SULFATE 50 MG/ML IJ SOLN
INTRAMUSCULAR | Status: DC | PRN
  Administered 2018-05-14 (×2): 10 mg via INTRAVENOUS

## 2018-05-14 MED ORDER — LIDOCAINE HCL (CARDIAC) PF 100 MG/5ML IV SOSY
PREFILLED_SYRINGE | INTRAVENOUS | Status: DC | PRN
  Administered 2018-05-14: 100 mg via INTRAVENOUS

## 2018-05-14 MED ORDER — LACTATED RINGERS IV SOLN
INTRAVENOUS | Status: DC
  Administered 2018-05-14: 18:00:00 via INTRAVENOUS

## 2018-05-14 MED ORDER — ONDANSETRON HCL 4 MG/2ML IJ SOLN
INTRAMUSCULAR | Status: AC
  Filled 2018-05-14: qty 2

## 2018-05-14 MED ORDER — PROMETHAZINE HCL 25 MG/ML IJ SOLN: 6.2500 mg | INTRAMUSCULAR | Status: DC | PRN

## 2018-05-14 MED ORDER — MIDAZOLAM HCL 5 MG/5ML IJ SOLN
INTRAMUSCULAR | Status: DC | PRN
  Administered 2018-05-14: 2 mg via INTRAVENOUS

## 2018-05-14 MED ORDER — MEPERIDINE HCL 50 MG/ML IJ SOLN: 6.2500 mg | INTRAMUSCULAR | Status: DC | PRN

## 2018-05-14 MED ORDER — PROPOFOL 10 MG/ML IV BOLUS
INTRAVENOUS | Status: DC | PRN
  Administered 2018-05-14: 200 mg via INTRAVENOUS

## 2018-05-14 MED ORDER — HYDROMORPHONE HCL 1 MG/ML IJ SOLN
0.2500 mg | INTRAMUSCULAR | Status: DC | PRN
  Administered 2018-05-14 (×4): 0.5 mg via INTRAVENOUS

## 2018-05-14 MED ORDER — FENTANYL CITRATE (PF) 250 MCG/5ML IJ SOLN
INTRAMUSCULAR | Status: AC
  Filled 2018-05-14: qty 5

## 2018-05-14 MED ORDER — SODIUM CHLORIDE 0.9 % IR SOLN
Status: DC | PRN
  Administered 2018-05-14: 6000 mL

## 2018-05-14 MED ORDER — ONDANSETRON HCL 4 MG/2ML IJ SOLN
INTRAMUSCULAR | Status: DC | PRN
  Administered 2018-05-14: 4 mg via INTRAVENOUS

## 2018-05-14 SURGICAL SUPPLY — 44 items
BANDAGE ACE 4X5 VEL STRL LF (GAUZE/BANDAGES/DRESSINGS) ×3 IMPLANT
BNDG COHESIVE 4X5 TAN STRL (GAUZE/BANDAGES/DRESSINGS) ×3 IMPLANT
BNDG GAUZE ELAST 4 BULKY (GAUZE/BANDAGES/DRESSINGS) ×3 IMPLANT
BNDG GAUZE STRTCH 6 (GAUZE/BANDAGES/DRESSINGS) ×9 IMPLANT
BRUSH SCRUB SURG 4.25 DISP (MISCELLANEOUS) ×6 IMPLANT
COVER SURGICAL LIGHT HANDLE (MISCELLANEOUS) ×6 IMPLANT
COVER WAND RF STERILE (DRAPES) ×3 IMPLANT
DRAPE U-SHAPE 47X51 STRL (DRAPES) ×3 IMPLANT
DRSG ADAPTIC 3X8 NADH LF (GAUZE/BANDAGES/DRESSINGS) ×3 IMPLANT
ELECT REM PT RETURN 9FT ADLT (ELECTROSURGICAL)
ELECTRODE REM PT RTRN 9FT ADLT (ELECTROSURGICAL) IMPLANT
GAUZE SPONGE 4X4 12PLY STRL (GAUZE/BANDAGES/DRESSINGS) ×3 IMPLANT
GLOVE BIO SURGEON STRL SZ7.5 (GLOVE) ×3 IMPLANT
GLOVE BIO SURGEON STRL SZ8 (GLOVE) ×3 IMPLANT
GLOVE BIOGEL PI IND STRL 7.5 (GLOVE) ×1 IMPLANT
GLOVE BIOGEL PI IND STRL 8 (GLOVE) ×1 IMPLANT
GLOVE BIOGEL PI INDICATOR 7.5 (GLOVE) ×2
GLOVE BIOGEL PI INDICATOR 8 (GLOVE) ×2
GOWN STRL REUS W/ TWL LRG LVL3 (GOWN DISPOSABLE) ×2 IMPLANT
GOWN STRL REUS W/ TWL XL LVL3 (GOWN DISPOSABLE) ×1 IMPLANT
GOWN STRL REUS W/TWL LRG LVL3 (GOWN DISPOSABLE) ×4
GOWN STRL REUS W/TWL XL LVL3 (GOWN DISPOSABLE) ×2
HANDPIECE INTERPULSE COAX TIP (DISPOSABLE)
KIT BASIN OR (CUSTOM PROCEDURE TRAY) ×3 IMPLANT
KIT TURNOVER KIT B (KITS) ×3 IMPLANT
MANIFOLD NEPTUNE II (INSTRUMENTS) ×3 IMPLANT
NS IRRIG 1000ML POUR BTL (IV SOLUTION) ×3 IMPLANT
PACK ORTHO EXTREMITY (CUSTOM PROCEDURE TRAY) ×3 IMPLANT
PAD ARMBOARD 7.5X6 YLW CONV (MISCELLANEOUS) ×6 IMPLANT
PAD CAST 4YDX4 CTTN HI CHSV (CAST SUPPLIES) ×1 IMPLANT
PADDING CAST COTTON 4X4 STRL (CAST SUPPLIES) ×2
PADDING CAST COTTON 6X4 STRL (CAST SUPPLIES) ×3 IMPLANT
SET HNDPC FAN SPRY TIP SCT (DISPOSABLE) IMPLANT
SPONGE LAP 18X18 X RAY DECT (DISPOSABLE) ×3 IMPLANT
STOCKINETTE IMPERVIOUS 9X36 MD (GAUZE/BANDAGES/DRESSINGS) ×3 IMPLANT
SUT PDS AB 2-0 CT1 27 (SUTURE) IMPLANT
SWAB CULTURE ESWAB REG 1ML (MISCELLANEOUS) IMPLANT
TOWEL OR 17X24 6PK STRL BLUE (TOWEL DISPOSABLE) ×3 IMPLANT
TOWEL OR 17X26 10 PK STRL BLUE (TOWEL DISPOSABLE) ×6 IMPLANT
TUBE CONNECTING 12'X1/4 (SUCTIONS) ×1
TUBE CONNECTING 12X1/4 (SUCTIONS) ×2 IMPLANT
UNDERPAD 30X30 (UNDERPADS AND DIAPERS) ×3 IMPLANT
WATER STERILE IRR 1000ML POUR (IV SOLUTION) ×3 IMPLANT
YANKAUER SUCT BULB TIP NO VENT (SUCTIONS) ×3 IMPLANT

## 2018-05-14 NOTE — Progress Notes (Signed)
Security at bedside educating pt

## 2018-05-14 NOTE — Plan of Care (Signed)
  Problem: Activity: Goal: Risk for activity intolerance will decrease Outcome: Progressing   Problem: Pain Managment: Goal: General experience of comfort will improve Outcome: Progressing   Problem: Skin Integrity: Goal: Risk for impaired skin integrity will decrease Outcome: Progressing   

## 2018-05-14 NOTE — Progress Notes (Signed)
OR called to ask pt last intake  Pt had breakfast  Informed OR staff, aware    Incorrect antibiotics, called pharmacy to send correct dose Awaiting delivery

## 2018-05-14 NOTE — Anesthesia Preprocedure Evaluation (Signed)
Anesthesia Evaluation  Patient identified by MRN, date of birth, ID band Patient awake    Reviewed: Allergy & Precautions, NPO status , Patient's Chart, lab work & pertinent test results  Airway Mallampati: II  TM Distance: >3 FB Neck ROM: Full    Dental  (+) Poor Dentition   Pulmonary Current Smoker,    breath sounds clear to auscultation       Cardiovascular negative cardio ROS   Rhythm:Regular Rate:Normal     Neuro/Psych negative psych ROS   GI/Hepatic negative GI ROS, (+) Hepatitis -, C  Endo/Other  negative endocrine ROS  Renal/GU negative Renal ROS     Musculoskeletal negative musculoskeletal ROS (+)   Abdominal Normal abdominal exam  (+)   Peds  Hematology negative hematology ROS (+)   Anesthesia Other Findings   Reproductive/Obstetrics                             Anesthesia Physical Anesthesia Plan  ASA: II  Anesthesia Plan: General   Post-op Pain Management:    Induction: Intravenous  PONV Risk Score and Plan: 2 and Ondansetron and Dexamethasone  Airway Management Planned: LMA  Additional Equipment: None  Intra-op Plan:   Post-operative Plan: Extubation in OR  Informed Consent: I have reviewed the patients History and Physical, chart, labs and discussed the procedure including the risks, benefits and alternatives for the proposed anesthesia with the patient or authorized representative who has indicated his/her understanding and acceptance.   Dental advisory given  Plan Discussed with: CRNA  Anesthesia Plan Comments:         Anesthesia Quick Evaluation

## 2018-05-14 NOTE — Progress Notes (Signed)
PROGRESS NOTE  Brett [name] ZOX:[id number] DOB: 27-Sep-1980 DOA: 05/12/2018 PCP: Patient, No Pcp Per   LOS: 2 days   Brief Narrative / Interim history: 37 year old male with history of IV drug abuse, hep C, who was admitted to the hospital on 11/14 with dyspnea and pleuritic type chest pain.  CT scan on admission showed possible septic emboli.  He was admitted to the hospital, placed on IV Vanco and cefepime and ID was consulted.  Of note, he was evaluated in the ED in September and at that time cultures showed Pseudomonas bacteremia for which she was never fully treated.  He left AMA at one point and was on ciprofloxacin based on Pseudomonas sensitivities but it is not clear how consistently patient took this.  Subjective: Smells like cigarette smoke in the room upon entering, patient denies smoking.  Chest pain is improving however feels like his right elbow is hurting more today.  Denies any fever or chills.  Assessment & Plan: Active Problems:   IV drug abuse (HCC)   Septic embolism (HCC)   Lingular pneumonia with possible septic emboli -ID consulted, appreciate input, on Vanco and cefepime -2D echo negative, will request a TEE  Right arm abscess -MRI obtained yesterday showed abscesses in the anterior soft tissues just above the elbow joint surrounding the brachial artery and veins and median nerve with adjacent myositis and cellulitis -Consulted orthopedic surgery, looks like he will be taken to the OR in the afternoon today  IV drug use -Continue clonidine as it worked for the patient's withdrawal symptoms in the past, will give oxy intermittently for pain.  Avoid IV narcotics.  Consulted Child psychotherapist as patient has the desire to quit drugs  Hep C -ID following    Scheduled Meds: . nicotine  21 mg Transdermal Daily   Continuous Infusions: . sodium chloride Stopped (05/14/18 0531)  . ceFEPime (MAXIPIME) IV 2 g (05/14/18 0530)  . vancomycin 1,250 mg (05/14/18 1116)    PRN Meds:.sodium chloride, acetaminophen **OR** acetaminophen, cloNIDine, ondansetron **OR** ondansetron (ZOFRAN) IV, oxyCODONE  DVT prophylaxis: Lovenox Code Status: Full code Family Communication: Girlfriend at bedside Disposition Plan: To be determined  Consultants:   ID  Procedures:   2D echo: pending  Antimicrobials:  Vancomycin 11/14 >>  Cefepime 11/14 >>   Objective: Vitals:   05/14/18 0307 05/14/18 0821 05/14/18 1121 05/14/18 1222  BP: 117/83 115/74 124/72 121/71  Pulse: 79 71 80 81  Resp: 18 15 12 16   Temp: 98.1 F (36.7 C) 98.3 F (36.8 C)  98.1 F (36.7 C)  TempSrc: Oral Oral  Oral  SpO2: 98%  97% 97%  Weight: 80.4 kg     Height:        Intake/Output Summary (Last 24 hours) at 05/14/2018 1231 Last data filed at 05/14/2018 1000 Gross per 24 hour  Intake 1157.12 ml  Output 1500 ml  Net -342.88 ml   Filed Weights   05/12/18 1628 05/13/18 0441 05/14/18 0307  Weight: 77.9 kg 77.8 kg 80.4 kg    Examination:  Constitutional: No distress Eyes: No scleral icterus ENMT: Moist mucous membranes Respiratory: CTA bilaterally without wheezing or crackles, normal respiratory effort Cardiovascular: Regular rate and rhythm, no murmurs appreciated.  No edema. Abdomen: Soft, nontender, nondistended, positive bowel sounds Musculoskeletal: no clubbing / cyanosis.  Right elbow appears swollen, no erythema noted.  An area of induration is palpated on the medial aspect Skin: No rashes, needle marks both arms Neurologic: Nonfocal  Data Reviewed: I have independently reviewed  following labs and imaging studies  CBC: Recent Labs  Lab 05/11/18 1158 05/12/18 1121 05/13/18 0554  WBC 12.4* 15.0* 11.8*  NEUTROABS 9.7* 13.9* 7.5  HGB 12.3* 14.1 12.7*  HCT 39.2 43.7 38.8*  MCV 83.9 82.5 80.3  PLT 286 284 347   Basic Metabolic Panel: Recent Labs  Lab 05/11/18 1158 05/12/18 1121 05/13/18 0554  NA 135 133* 137  K 4.1 4.1 3.9  CL 103 102 106  CO2 26 23 22    GLUCOSE 174* 150* 166*  BUN 6 8 5*  CREATININE 0.71 0.72 0.88  CALCIUM 8.5* 8.8* 8.4*  MG  --   --  2.0   GFR: Estimated Creatinine Clearance: 127.4 mL/min (by C-G formula based on SCr of 0.88 mg/dL). Liver Function Tests: Recent Labs  Lab 05/12/18 1121 05/13/18 0554  AST 16 14*  ALT 30 22  ALKPHOS 119 93  BILITOT <0.1* 0.3  PROT 6.4* 6.1*  ALBUMIN 2.8* 2.5*   No results for input(s): LIPASE, AMYLASE in the last 168 hours. No results for input(s): AMMONIA in the last 168 hours. Coagulation Profile: No results for input(s): INR, PROTIME in the last 168 hours. Cardiac Enzymes: No results for input(s): CKTOTAL, CKMB, CKMBINDEX, TROPONINI in the last 168 hours. BNP (last 3 results) No results for input(s): PROBNP in the last 8760 hours. HbA1C: No results for input(s): HGBA1C in the last 72 hours. CBG: Recent Labs  Lab 05/11/18 1339  GLUCAP 126*   Lipid Profile: No results for input(s): CHOL, HDL, LDLCALC, TRIG, CHOLHDL, LDLDIRECT in the last 72 hours. Thyroid Function Tests: No results for input(s): TSH, T4TOTAL, FREET4, T3FREE, THYROIDAB in the last 72 hours. Anemia Panel: No results for input(s): VITAMINB12, FOLATE, FERRITIN, TIBC, IRON, RETICCTPCT in the last 72 hours. Urine analysis:    Component Value Date/Time   COLORURINE YELLOW 03/02/2018 1227   APPEARANCEUR CLOUDY (A) 03/02/2018 1227   LABSPEC 1.019 03/02/2018 1227   PHURINE 9.0 (H) 03/02/2018 1227   GLUCOSEU NEGATIVE 03/02/2018 1227   HGBUR NEGATIVE 03/02/2018 1227   BILIRUBINUR NEGATIVE 03/02/2018 1227   KETONESUR NEGATIVE 03/02/2018 1227   PROTEINUR 30 (A) 03/02/2018 1227   NITRITE NEGATIVE 03/02/2018 1227   LEUKOCYTESUR NEGATIVE 03/02/2018 1227   Sepsis Labs: Invalid input(s): PROCALCITONIN, LACTICIDVEN  Recent Results (from the past 240 hour(s))  Blood culture (routine single)     Status: None (Preliminary result)   Collection Time: 05/12/18 11:39 AM  Result Value Ref Range Status   Specimen  Description BLOOD RIGHT ARM  Final   Special Requests   Final    BOTTLES DRAWN AEROBIC AND ANAEROBIC Blood Culture adequate volume   Culture   Final    NO GROWTH 2 DAYS Performed at [facility], [address] [place], [place] [address]    Report Status PENDING  Incomplete  Blood Culture (routine x 2)     Status: None (Preliminary result)   Collection Time: 05/12/18 12:05 PM  Result Value Ref Range Status   Specimen Description BLOOD LEFT ARM  Final   Special Requests NONE  Final   Culture   Final    NO GROWTH 2 DAYS Performed at [facility], [address] [place], [place] [address]    Report Status PENDING  Incomplete      Radiology Studies: Mr Elbow Right Wo Contrast  Result Date: 05/13/2018 CLINICAL DATA:  Right elbow swelling and limited range of motion. IV drug abuse. EXAM: MRI OF THE RIGHT ELBOW WITHOUT CONTRAST  TECHNIQUE: Multiplanar, multisequence MR imaging of the elbow was performed. The patient declined contrast administration. COMPARISON:  Radiographs dated 05/23/2017 FINDINGS: Soft tissues: There is a 5 x 4 x 3.5 cm abscess surrounding the brachial artery and veins and median nerve arising 5 cm proximal to the elbow joint. There is abnormal edema in the brachialis muscle. There are 2 small fluid collections in the soft tissues superficial to the proximal pronator teres muscle just proximal to the medial epicondyle of the distal humerus. These are probably small abscesses as well. There is extensive subcutaneous edema in the distal upper arm and proximal forearm consistent with cellulitis. The muscle edema extends into the flexor muscles just distal to the medial epicondyle. TENDONS Common forearm flexor origin: Normal. Common forearm extensor origin: Normal. Biceps: Normal. Triceps: Normal. LIGAMENTS Medial stabilizers: Intact. Lateral stabilizers:  Intact.  A Cartilage: Normal. Joint: Minimal fluid in the elbow joint. Cubital tunnel: Normal. Bones: No  osteomyelitis or other acute bone abnormality. IMPRESSION: Abscesses in the anterior soft tissues just above the elbow joint surrounding the brachial artery and veins and median nerve with adjacent myositis and cellulitis. Electronically Signed   By: Francene Boyers M.D.   On: 05/13/2018 16:15    Pamella Pert, MD, PhD [facility] Pager [phone]  If 7PM-7AM, please contact night-coverage [email or web address] Password [facility] 05/14/2018, 12:31 PM

## 2018-05-14 NOTE — Transfer of Care (Signed)
Immediate Anesthesia Transfer of Care Note  Patient: Brett [name]  Procedure(s) Performed: IRRIGATION AND DEBRIDEMENT RIGHT ANTECUBITAL ABSCESS (Right Arm Upper)  Patient Location: PACU  Anesthesia Type:General  Level of Consciousness: sedated  Airway & Oxygen Therapy: Patient Spontanous Breathing and Patient connected to face mask oxygen  Post-op Assessment: Report given to RN and Post -op Vital signs reviewed and stable  Post vital signs: Reviewed and stable  Last Vitals:  Vitals Value Taken Time  BP 108/60 05/14/2018  6:41 PM  Temp    Pulse 80 05/14/2018  6:41 PM  Resp 15 05/14/2018  6:41 PM  SpO2 99 % 05/14/2018  6:41 PM  Vitals shown include unvalidated device data.  Last Pain:  Vitals:   05/14/18 1353  TempSrc:   PainSc: 7       Patients Stated Pain Goal: 0 (05/14/18 1329)  Complications: No apparent anesthesia complications

## 2018-05-14 NOTE — Progress Notes (Signed)
Informed OR, pt scheduled for 1730 I & D

## 2018-05-14 NOTE — Progress Notes (Signed)
Pt refusing IV vancomycin at this time  Pt states "I have been hooked up to this constantly" Pt states "I want to eat breakfast" Attempted to educate pt   Pt states "I've been here long enough to know" Will continue to monitor And try to hang antibiotics at a later time

## 2018-05-14 NOTE — Anesthesia Procedure Notes (Signed)
Procedure Name: LMA Insertion Date/Time: 05/14/2018 6:44 PM Performed by: Rosiland OzMeyers, Delroy Ordway, CRNA Pre-anesthesia Checklist: Patient identified, Emergency Drugs available, Suction available, Patient being monitored and Timeout performed Patient Re-evaluated:Patient Re-evaluated prior to induction Oxygen Delivery Method: Circle system utilized Preoxygenation: Pre-oxygenation with 100% oxygen Induction Type: IV induction Ventilation: Mask ventilation without difficulty LMA: LMA inserted LMA Size: 4.0 Number of attempts: 1 Placement Confirmation: positive ETCO2 and breath sounds checked- equal and bilateral Tube secured with: Tape Dental Injury: Teeth and Oropharynx as per pre-operative assessment

## 2018-05-14 NOTE — Consult Note (Addendum)
Right antecubital abscess, IV drug abuse S/p left hand surgical debridement by Dr. Iran Planas 6 wks ago.  Will require surgical debridement Patient now NPO Full consult to follow.  Brett [place], MD [facility], [facility] [phone]  _______________________________________________________________________  Orthopaedic Consultation  Reason for Consult: right antecubital abscess Referring Physician: Guilford Shi, MD  Brett [name] is an 37 y.o. male.  HPI: Repeated IV drug use with three prior left antecubital I&D's remotely. 6 wks ago left hand I&D by Dr. Iran Planas. Denies numbness and tingling in the hand. Admitted for septic emboli. On IM service with ID consultative support.  Past Medical History:  Diagnosis Date  . Hepatitis C     Past Surgical History:  Procedure Laterality Date  . I&D EXTREMITY Left 03/02/2018   Procedure: IRRIGATION AND DEBRIDEMENT EXTREMITY;  Surgeon: Iran Planas, MD;  Location: [place];  Service: Orthopedics;  Laterality: Left;  . TONSILLECTOMY      No family history on file.  Social History:  reports that he has been smoking cigarettes. He has been smoking about 1.00 pack per day. He has never used smokeless tobacco. He reports that he has current or past drug history. Drugs: Cocaine, Marijuana, and IV. He reports that he does not drink alcohol.  Allergies:  Allergies  Allergen Reactions  . Penicillins Shortness Of Breath, Itching and Rash    Has patient had a PCN reaction causing immediate rash, facial/tongue/throat swelling, SOB or lightheadedness with hypotension: Yes Has patient had a PCN reaction causing severe rash involving mucus membranes or skin necrosis: Unk Has patient had a PCN reaction that required hospitalization: Unk Has patient had a PCN reaction occurring within the last 10 years: No If all of the above answers are "NO", then may proceed with Cephalosporin use.     Medications:  Prior to Admission:  No  medications prior to admission.    Results for orders placed or performed during the hospital encounter of 05/12/18 (from the past 48 hour(s))  Comprehensive metabolic panel     Status: Abnormal   Collection Time: 05/12/18 11:21 AM  Result Value Ref Range   Sodium 133 (L) 135 - 145 mmol/L   Potassium 4.1 3.5 - 5.1 mmol/L   Chloride 102 98 - 111 mmol/L   CO2 23 22 - 32 mmol/L   Glucose, Bld 150 (H) 70 - 99 mg/dL   BUN 8 6 - 20 mg/dL   Creatinine, Ser 0.72 0.61 - 1.24 mg/dL   Calcium 8.8 (L) 8.9 - 10.3 mg/dL   Total Protein 6.4 (L) 6.5 - 8.1 g/dL   Albumin 2.8 (L) 3.5 - 5.0 g/dL   AST 16 15 - 41 U/L   ALT 30 0 - 44 U/L   Alkaline Phosphatase 119 38 - 126 U/L   Total Bilirubin <0.1 (L) 0.3 - 1.2 mg/dL   GFR calc non Af Amer >60 >60 mL/min   GFR calc Af Amer >60 >60 mL/min    Comment: (NOTE) The eGFR has been calculated using the CKD EPI equation. This calculation has not been validated in all clinical situations. eGFR's persistently <60 mL/min signify possible Chronic Kidney Disease.    Anion gap 8 5 - 15    Comment: Performed at [facility] [address] [place], [place] [address]  CBC WITH DIFFERENTIAL     Status: Abnormal   Collection Time: 05/12/18 11:21 AM  Result Value Ref Range   WBC 15.0 (H) 4.0 - 10.5 K/uL   RBC  5.30 4.22 - 5.81 MIL/uL   Hemoglobin 14.1 13.0 - 17.0 g/dL   HCT 43.7 39.0 - 52.0 %   MCV 82.5 80.0 - 100.0 fL   MCH 26.6 26.0 - 34.0 pg   MCHC 32.3 30.0 - 36.0 g/dL   RDW 13.4 11.5 - 15.5 %   Platelets 284 150 - 400 K/uL   nRBC 0.0 0.0 - 0.2 %   Neutrophils Relative % 93 %   Neutro Abs 13.9 (H) 1.7 - 7.7 K/uL   Lymphocytes Relative 4 %   Lymphs Abs 0.7 0.7 - 4.0 K/uL   Monocytes Relative 2 %   Monocytes Absolute 0.3 0.1 - 1.0 K/uL   Eosinophils Relative 1 %   Eosinophils Absolute 0.1 0.0 - 0.5 K/uL   Basophils Relative 0 %   Basophils Absolute 0.0 0.0 - 0.1 K/uL   Immature Granulocytes 0 %   Abs Immature Granulocytes 0.06 0.00 - 0.07  K/uL    Comment: Performed at [facility] [address], [place], [place] [address]  Blood culture (routine single)     Status: None (Preliminary result)   Collection Time: 05/12/18 11:39 AM  Result Value Ref Range   Specimen Description BLOOD RIGHT ARM    Special Requests      BOTTLES DRAWN AEROBIC AND ANAEROBIC Blood Culture adequate volume   Culture      NO GROWTH 2 DAYS Performed at [facility], [place] [address], [place], [place] [address]    Report Status PENDING   I-Stat CG4 Lactic Acid, ED     Status: None   Collection Time: 05/12/18 11:59 AM  Result Value Ref Range   Lactic Acid, Venous 1.52 0.5 - 1.9 mmol/L  Blood Culture (routine x 2)     Status: None (Preliminary result)   Collection Time: 05/12/18 12:05 PM  Result Value Ref Range   Specimen Description BLOOD LEFT ARM    Special Requests NONE    Culture      NO GROWTH 2 DAYS Performed at [facility] [address], [place], [place] [address]    Report Status PENDING   CBC with Differential/Platelet     Status: Abnormal   Collection Time: 05/13/18  5:54 AM  Result Value Ref Range   WBC 11.8 (H) 4.0 - 10.5 K/uL   RBC 4.83 4.22 - 5.81 MIL/uL   Hemoglobin 12.7 (L) 13.0 - 17.0 g/dL   HCT 38.8 (L) 39.0 - 52.0 %   MCV 80.3 80.0 - 100.0 fL   MCH 26.3 26.0 - 34.0 pg   MCHC 32.7 30.0 - 36.0 g/dL   RDW 13.3 11.5 - 15.5 %   Platelets 347 150 - 400 K/uL   nRBC 0.0 0.0 - 0.2 %   Neutrophils Relative % 63 %   Neutro Abs 7.5 1.7 - 7.7 K/uL   Lymphocytes Relative 24 %   Lymphs Abs 2.9 0.7 - 4.0 K/uL   Monocytes Relative 9 %   Monocytes Absolute 1.0 0.1 - 1.0 K/uL   Eosinophils Relative 3 %   Eosinophils Absolute 0.4 0.0 - 0.5 K/uL   Basophils Relative 0 %   Basophils Absolute 0.0 0.0 - 0.1 K/uL   Immature Granulocytes 1 %   Abs Immature Granulocytes 0.07 0.00 - 0.07 K/uL    Comment: Performed at [facility], [address], [place], [place] [address]  Comprehensive metabolic panel      Status: Abnormal   Collection Time: 05/13/18  5:54 AM  Result Value Ref Range   Sodium 137 135 - 145 mmol/L   Potassium 3.9 3.5 - 5.1 mmol/L   Chloride 106 98 - 111 mmol/L   CO2 22 22 - 32 mmol/L   Glucose, Bld 166 (H) 70 - 99 mg/dL   BUN 5 (L) 6 - 20 mg/dL   Creatinine, Ser 0.88 0.61 - 1.24 mg/dL   Calcium 8.4 (L) 8.9 - 10.3 mg/dL   Total Protein 6.1 (L) 6.5 - 8.1 g/dL   Albumin 2.5 (L) 3.5 - 5.0 g/dL   AST 14 (L) 15 - 41 U/L   ALT 22 0 - 44 U/L   Alkaline Phosphatase 93 38 - 126 U/L   Total Bilirubin 0.3 0.3 - 1.2 mg/dL   GFR calc non Af Amer >60 >60 mL/min   GFR calc Af Amer >60 >60 mL/min    Comment: (NOTE) The eGFR has been calculated using the CKD EPI equation. This calculation has not been validated in all clinical situations. eGFR's persistently <60 mL/min signify possible Chronic Kidney Disease.    Anion gap 9 5 - 15    Comment: Performed at [facility] [address] [place], [place] [address]  Magnesium     Status: None   Collection Time: 05/13/18  5:54 AM  Result Value Ref Range   Magnesium 2.0 1.7 - 2.4 mg/dL    Comment: Performed at [facility] [address], [place], [place] [address]  Vancomycin, trough     Status: Abnormal   Collection Time: 05/13/18  7:13 PM  Result Value Ref Range   Vancomycin Tr 12 (L) 15 - 20 ug/mL    Comment: Performed at [facility] [address] [place], [place] [address]    Mr Elbow Right Wo Contrast  Result Date: 05/13/2018 CLINICAL DATA:  Right elbow swelling and limited range of motion. IV drug abuse. EXAM: MRI OF THE RIGHT ELBOW WITHOUT CONTRAST TECHNIQUE: Multiplanar, multisequence MR imaging of the elbow was performed. The patient declined contrast administration. COMPARISON:  Radiographs dated 05/23/2017 FINDINGS: Soft tissues: There is a 5 x 4 x 3.5 cm abscess surrounding the brachial artery and veins and median nerve arising 5 cm proximal to the elbow joint. There is abnormal edema in the  brachialis muscle. There are 2 small fluid collections in the soft tissues superficial to the proximal pronator teres muscle just proximal to the medial epicondyle of the distal humerus. These are probably small abscesses as well. There is extensive subcutaneous edema in the distal upper arm and proximal forearm consistent with cellulitis. The muscle edema extends into the flexor muscles just distal to the medial epicondyle. TENDONS Common forearm flexor origin: Normal. Common forearm extensor origin: Normal. Biceps: Normal. Triceps: Normal. LIGAMENTS Medial stabilizers: Intact. Lateral stabilizers:  Intact.  A Cartilage: Normal. Joint: Minimal fluid in the elbow joint. Cubital tunnel: Normal. Bones: No osteomyelitis or other acute bone abnormality. IMPRESSION: Abscesses in the anterior soft tissues just above the elbow joint surrounding the brachial artery and veins and median nerve with adjacent myositis and cellulitis. Electronically Signed   By: Lorriane Shire M.D.   On: 05/13/2018 16:15   Dg Chest Port 1 View  Result Date: 05/12/2018 CLINICAL DATA:  Pt seen yesterday and admitted for bacterial endocarditis r/t IV drug use. Pt left AMA yesterday and used heroin and fentanyl at 1900 last night. EXAM: PORTABLE CHEST 1 VIEW COMPARISON:  05/11/2018 chest x-ray and chest CT FINDINGS: There  is persistent patchy opacity in the lingula. Mild perihilar infiltrate also noted. No pulmonary edema. IMPRESSION: Infiltrate in the lingula and LEFT perihilar region. Electronically Signed   By: Nolon Nations M.D.   On: 05/12/2018 12:02    ROS Blood pressure 115/74, pulse 71, temperature 98.3 F (36.8 C), temperature source Oral, resp. rate 15, height '6\' 1"'  (1.854 m), weight 80.4 kg, SpO2 98 %. Physical Exam NCAT, dyed green hair No audible wheezing or chest retractions with breathing RRR RUEx shoulder, wrist, digits- no skin wounds, nontender, no instability, no blocks to motion  Elbow with swelling, erythema,  and tenderness, with associated decreased motion; old scars from prior surgeries  Sens  Ax/R/M/U intact  Mot   Ax/ R/ PIN/ M/ AIN/ U intact  Rad 2+  Assessment/Plan: Right antecubital abscess  I&D scheduled for later today. I discussed with the patient the risks and benefits of surgery, including the possibility of persistent infection, recurrent infection, prolonged open wound and need for dressing changes, nerve injury, vessel injury, wound breakdown, arthritis, DVT/ PE, loss of motion, and need for further surgery among others.  He acknowledged these risks and wished to proceed.  Brett [place], MD [facility], [facility] [phone]  05/14/2018  11:06 AM

## 2018-05-14 NOTE — Progress Notes (Signed)
RN transported with pt to OR   OR RN received pt and placed on telemetry

## 2018-05-14 NOTE — Progress Notes (Signed)
Update provided to pt wife   Transport at bedside   Report give to short stay

## 2018-05-14 NOTE — Progress Notes (Signed)
Pt significant other, Izora RibasColey [phone] Would like to be contact when pt returns to unit

## 2018-05-14 NOTE — Progress Notes (Signed)
MD aware pt had breakfast  Placed NPO sign on door  Staff aware  Pt educated

## 2018-05-14 NOTE — Brief Op Note (Signed)
05/14/2018  6:45 PM  PATIENT:  Salvadore FarberAlan Larsson  37 y.o. male  PRE-OPERATIVE DIAGNOSIS:  RIGHT ELBOW ANTECUBITAL ABSCESS  POST-OPERATIVE DIAGNOSIS:  RIGHT ELBOW ANTECUBITAL ABSCESS  PROCEDURE:  Procedure(s): INCISION AND DEBRIDEMENT RIGHT ANTECUBITAL ABSCESS (Right)  SURGEON:  Surgeon(s) and Role:    Myrene Galas* Nikitas Davtyan, MD - Primary  PHYSICIAN ASSISTANT: Montez MoritaKEITH PAUL, PA-C  ANESTHESIA:   general  EBL:  10 mL   BLOOD ADMINISTERED:none  DRAINS: penrose x 2  LOCAL MEDICATIONS USED:  NONE  SPECIMEN:  Source of Specimen:  abscess x 2  DISPOSITION OF SPECIMEN:  micro  COUNTS:  YES  TOURNIQUET:  * No tourniquets in log *  DICTATION: .Other Dictation: Dictation Number [id number]  PLAN OF CARE: Admit to inpatient   PATIENT DISPOSITION:  PACU - hemodynamically stable.   Delay start of Pharmacological VTE agent (>24hrs) due to surgical blood loss or risk of bleeding: no

## 2018-05-14 NOTE — Progress Notes (Signed)
Pt smoking in room  Security and MD called  Collected pt cigarette  Pt states he does not have a lighter  Educated pt on behavioral policy  MD called back and aware

## 2018-05-15 ENCOUNTER — Encounter (HOSPITAL_COMMUNITY): Payer: Self-pay | Admitting: Orthopedic Surgery

## 2018-05-15 DIAGNOSIS — L02419 Cutaneous abscess of limb, unspecified: Secondary | ICD-10-CM

## 2018-05-15 HISTORY — DX: Cutaneous abscess of limb, unspecified: L02.419

## 2018-05-15 LAB — VANCOMYCIN, RANDOM: Vancomycin Rm: 9

## 2018-05-15 LAB — BASIC METABOLIC PANEL
ANION GAP: 10 (ref 5–15)
BUN: 7 mg/dL (ref 6–20)
CO2: 25 mmol/L (ref 22–32)
Calcium: 8.6 mg/dL — ABNORMAL LOW (ref 8.9–10.3)
Chloride: 100 mmol/L (ref 98–111)
Creatinine, Ser: 0.73 mg/dL (ref 0.61–1.24)
Glucose, Bld: 123 mg/dL — ABNORMAL HIGH (ref 70–99)
Potassium: 3.9 mmol/L (ref 3.5–5.1)
Sodium: 135 mmol/L (ref 135–145)

## 2018-05-15 MED ORDER — SODIUM CHLORIDE 0.9% FLUSH: 10.0000 mL | INTRAVENOUS | Status: DC | PRN

## 2018-05-15 MED ORDER — SODIUM CHLORIDE 0.9 % IV SOLN
INTRAVENOUS | Status: DC
  Administered 2018-05-15: via INTRAVENOUS

## 2018-05-15 NOTE — Progress Notes (Signed)
PROGRESS NOTE  Yehonatan Grandison ZOX:[id number] DOB: [birth year] DOA: 05/12/2018 PCP: Patient, No Pcp Per   LOS: 3 days   Brief Narrative / Interim history: 37 year old male with history of IV drug abuse, hep C, who was admitted to the hospital on 11/14 with dyspnea and pleuritic type chest pain.  CT scan on admission showed possible septic emboli.  He was admitted to the hospital, placed on IV Vanco and cefepime and ID was consulted.  Of note, he was evaluated in the ED in September and at that time cultures showed Pseudomonas bacteremia for which she was never fully treated.  He left AMA at one point and was on ciprofloxacin based on Pseudomonas sensitivities but it is not clear how consistently patient took this.  Subjective: -Elbow feels better after the surgery, no longer has chest pain.  No palpitations, no fever or chills  Assessment & Plan: Active Problems:   IV drug abuse (HCC)   Septic embolism (HCC)   Lingular pneumonia with possible septic emboli -ID consulted, appreciate input, on Vanco and cefepime -2D echo negative, will request a TEE, cardiology likely plans hopefully in a day or 2  Right arm abscess -MRI obtained \\showed  abscesses in the anterior soft tissues just above the elbow joint surrounding the brachial artery and veins and median nerve with adjacent myositis and cellulitis -Consulted orthopedic surgery, status post IND 11/16, preliminary results shows staph aureus, sensitivities pending  IV drug use -Continue clonidine as it worked for the patient's withdrawal symptoms in the past, will give oxy intermittently for pain.  Avoid IV narcotics.  Consulted Child psychotherapist as patient has the desire to quit drugs  Hep C -ID following    Scheduled Meds: . nicotine  21 mg Transdermal Daily   Continuous Infusions: . sodium chloride Stopped (05/14/18 0531)  . ceFEPime (MAXIPIME) IV 2 g (05/15/18 8119)  . lactated ringers Stopped (05/14/18 1950)  . vancomycin 1,250 mg  (05/15/18 1157)   PRN Meds:.sodium chloride, acetaminophen **OR** acetaminophen, cloNIDine, ondansetron **OR** ondansetron (ZOFRAN) IV, oxyCODONE  DVT prophylaxis: Lovenox Code Status: Full code Family Communication: Girlfriend at bedside Disposition Plan: To be determined  Consultants:   ID  Procedures:   2D echo:  Study Conclusions - Left ventricle: The cavity size was normal. Wall thickness was normal. Systolic function was normal. The estimated ejection fraction was in the range of 55% to 60%. Wall motion was normal; there were no regional wall motion abnormalities. Left ventricular diastolic function parameters were normal.  Impressions: - Normal study.    Antimicrobials:  Vancomycin 11/14 >>  Cefepime 11/14 >>   Objective: Vitals:   05/14/18 1941 05/14/18 1951 05/15/18 0500 05/15/18 1130  BP: 107/75 111/68 140/84 133/81  Pulse: 72 72 88 (!) 112  Resp: 13 20 18 14   Temp:  98 F (36.7 C) (!) 97.5 F (36.4 C) 98.2 F (36.8 C)  TempSrc:  Oral Oral Oral  SpO2: 95% 100% 98% 97%  Weight:   79.4 kg   Height:        Intake/Output Summary (Last 24 hours) at 05/15/2018 1321 Last data filed at 05/15/2018 1478 Gross per 24 hour  Intake 1020 ml  Output 410 ml  Net 610 ml   Filed Weights   05/13/18 0441 05/14/18 0307 05/15/18 0500  Weight: 77.8 kg 80.4 kg 79.4 kg    Examination:  Constitutional: NAD Eyes: No icterus ENMT: Moist mucous membranes Respiratory: Clear to auscultation bilaterally, no wheezing or crackles heard Cardiovascular: Regular rate and rhythm,  no murmurs.  No peripheral edema. Abdomen: Soft, NT, ND, positive bowel sounds Musculoskeletal: No clubbing or cyanosis, right elbow Ace wrapped.  No tenderness in shoulders, knees, ankles, good range of motion Skin: needle marks both arms, no new rashes Neurologic: Nonfocal and ambulatory  Data Reviewed: I have independently reviewed following labs and imaging studies  CBC: Recent Labs  Lab  05/11/18 1158 05/12/18 1121 05/13/18 0554  WBC 12.4* 15.0* 11.8*  NEUTROABS 9.7* 13.9* 7.5  HGB 12.3* 14.1 12.7*  HCT 39.2 43.7 38.8*  MCV 83.9 82.5 80.3  PLT 286 284 347   Basic Metabolic Panel: Recent Labs  Lab 05/11/18 1158 05/12/18 1121 05/13/18 0554 05/15/18 0652  NA 135 133* 137 135  K 4.1 4.1 3.9 3.9  CL 103 102 106 100  CO2 26 23 22 25   GLUCOSE 174* 150* 166* 123*  BUN 6 8 5* 7  CREATININE 0.71 0.72 0.88 0.73  CALCIUM 8.5* 8.8* 8.4* 8.6*  MG  --   --  2.0  --    GFR: Estimated Creatinine Clearance: 139.2 mL/min (by C-G formula based on SCr of 0.73 mg/dL). Liver Function Tests: Recent Labs  Lab 05/12/18 1121 05/13/18 0554  AST 16 14*  ALT 30 22  ALKPHOS 119 93  BILITOT <0.1* 0.3  PROT 6.4* 6.1*  ALBUMIN 2.8* 2.5*   No results for input(s): LIPASE, AMYLASE in the last 168 hours. No results for input(s): AMMONIA in the last 168 hours. Coagulation Profile: No results for input(s): INR, PROTIME in the last 168 hours. Cardiac Enzymes: No results for input(s): CKTOTAL, CKMB, CKMBINDEX, TROPONINI in the last 168 hours. BNP (last 3 results) No results for input(s): PROBNP in the last 8760 hours. HbA1C: No results for input(s): HGBA1C in the last 72 hours. CBG: Recent Labs  Lab 05/11/18 1339  GLUCAP 126*   Lipid Profile: No results for input(s): CHOL, HDL, LDLCALC, TRIG, CHOLHDL, LDLDIRECT in the last 72 hours. Thyroid Function Tests: No results for input(s): TSH, T4TOTAL, FREET4, T3FREE, THYROIDAB in the last 72 hours. Anemia Panel: No results for input(s): VITAMINB12, FOLATE, FERRITIN, TIBC, IRON, RETICCTPCT in the last 72 hours. Urine analysis:    Component Value Date/Time   COLORURINE YELLOW 03/02/2018 1227   APPEARANCEUR CLOUDY (A) 03/02/2018 1227   LABSPEC 1.019 03/02/2018 1227   PHURINE 9.0 (H) 03/02/2018 1227   GLUCOSEU NEGATIVE 03/02/2018 1227   HGBUR NEGATIVE 03/02/2018 1227   BILIRUBINUR NEGATIVE 03/02/2018 1227   KETONESUR NEGATIVE  03/02/2018 1227   PROTEINUR 30 (A) 03/02/2018 1227   NITRITE NEGATIVE 03/02/2018 1227   LEUKOCYTESUR NEGATIVE 03/02/2018 1227   Sepsis Labs: Invalid input(s): PROCALCITONIN, LACTICIDVEN  Recent Results (from the past 240 hour(s))  Blood culture (routine single)     Status: None (Preliminary result)   Collection Time: 05/12/18 11:39 AM  Result Value Ref Range Status   Specimen Description BLOOD RIGHT ARM  Final   Special Requests   Final    BOTTLES DRAWN AEROBIC AND ANAEROBIC Blood Culture adequate volume   Culture   Final    NO GROWTH 2 DAYS Performed at [facility], [address] [place], [place] [id number]    Report Status PENDING  Incomplete  Blood Culture (routine x 2)     Status: None (Preliminary result)   Collection Time: 05/12/18 12:05 PM  Result Value Ref Range Status   Specimen Description BLOOD LEFT ARM  Final   Special Requests NONE  Final   Culture   Final  NO GROWTH 2 DAYS Performed at [facility], [address] [place], [place] [address]    Report Status PENDING  Incomplete  Aerobic/Anaerobic Culture (surgical/deep wound)     Status: None (Preliminary result)   Collection Time: 05/14/18  6:18 PM  Result Value Ref Range Status   Specimen Description ABSCESS RIGHT ANTECUBITAL  Final   Special Requests NONE  Final   Gram Stain   Final    ABUNDANT WBC PRESENT, PREDOMINANTLY PMN MODERATE GRAM POSITIVE COCCI Performed at [facility], [address], [place], [place] [address]    Culture MODERATE STAPHYLOCOCCUS AUREUS  Final   Report Status PENDING  Incomplete      Radiology Studies: Mr Elbow Right Wo Contrast  Result Date: 05/13/2018 CLINICAL DATA:  Right elbow swelling and limited range of motion. IV drug abuse. EXAM: MRI OF THE RIGHT ELBOW WITHOUT CONTRAST TECHNIQUE: Multiplanar, multisequence MR imaging of the elbow was performed. The patient declined contrast administration. COMPARISON:  Radiographs dated 05/23/2017 FINDINGS:  Soft tissues: There is a 5 x 4 x 3.5 cm abscess surrounding the brachial artery and veins and median nerve arising 5 cm proximal to the elbow joint. There is abnormal edema in the brachialis muscle. There are 2 small fluid collections in the soft tissues superficial to the proximal pronator teres muscle just proximal to the medial epicondyle of the distal humerus. These are probably small abscesses as well. There is extensive subcutaneous edema in the distal upper arm and proximal forearm consistent with cellulitis. The muscle edema extends into the flexor muscles just distal to the medial epicondyle. TENDONS Common forearm flexor origin: Normal. Common forearm extensor origin: Normal. Biceps: Normal. Triceps: Normal. LIGAMENTS Medial stabilizers: Intact. Lateral stabilizers:  Intact.  A Cartilage: Normal. Joint: Minimal fluid in the elbow joint. Cubital tunnel: Normal. Bones: No osteomyelitis or other acute bone abnormality. IMPRESSION: Abscesses in the anterior soft tissues just above the elbow joint surrounding the brachial artery and veins and median nerve with adjacent myositis and cellulitis. Electronically Signed   By: Francene Boyers M.D.   On: 05/13/2018 16:15    Pamella Pert, MD, PhD [facility] Pager [phone]  If 7PM-7AM, please contact night-coverage [email or web address] Password [email or web address] 05/15/2018, 1:21 PM

## 2018-05-15 NOTE — Op Note (Signed)
NAMSalvadore Farber: [name], Brett [name] MEDICAL RECORD [id number] ACCOUNT [id number] DATE OF BIRTH:July 21, 1980 FACILITY: [facility] LOCATION: [facility] PHYSICIAN:Ciaira Natividad H. Adelynne Joerger, MD  OPERATIVE REPORT  DATE OF PROCEDURE:  05/14/2018  PREOPERATIVE DIAGNOSES:  Right elbow antecubital abscess.  POSTOPERATIVE DIAGNOSES:  Right elbow antecubital abscess.  PROCEDURE:  Incision and drainage with debridement of right antecubital abscess.  SURGEON:  Myrene GalasMichael Nada Godley, MD  ASSISTANT:  Montez MoritaKeith Paul, PA-C  ANESTHESIA:  General.  COMPLICATIONS:  None.  TOURNIQUET:  None.  ESTIMATED BLOOD LOSS:  15 mL.  DRAINS:  Two Penrose.  SPECIMENS:  Two anaerobic and aerobic culture sent to micro.  DISPOSITION:  To PACU.  CONDITION:  Stable.  BRIEF SUMMARY OF INDICATIONS FOR PROCEDURE:  The patient is a 37 year old male with a strong history of IV drug use for which he has undergone multiple debridements, including 3 prior ones per his report in the same elbow and location.  Six weeks ago, he  underwent an incision and drainage by Dr. Bradly BienenstockFred Ortmann for the contralateral hand.  He was admitted for septic emboli this admission, and subsequent evaluation revealed tenderness, redness and swelling in the antecubital fossa of the right elbow.  This  was confirmed by MRI.  I discussed the risks and benefits of surgical incision and drainage with the patient including potential for persistent infection, recurrent infection, scarring, loss of motion, nerve injury, vessel injury, wound healing problems  and multiple others, and he strongly wished to proceed.  BRIEF SUMMARY OF PROCEDURE:  The patient was taken to the operating room where general anesthesia was induced.  His right upper extremity was prepped and draped in the usual sterile fashion.  A tourniquet was used around the arm but never inflated during  the procedure.  After a timeout, the area of maximal fluctuance at the elbow crease was incised, extending distally from that  transverse crease in a vertical fashion.  Immediate purulence under pressure jetted from the wound, and this material was  cultured, sending both anaerobic and aerobic specimens to the lab.  The entire cavity was evacuated using a Tresa EndoKelly for assistance.  The incision was extended at the skin and subcutaneous tissue level to 3.5-4 cm.  This enabled us to pass a chlorhexidine  and saline-soaked Ray-Tec to facilitate removal of fibrinous material as well.  Three liters of high-volume, low-pressure saline were then used to irrigate the cavity and Penrose drains placed as well as a gauze and a compressive dressing.  No suture was  used to close the wound.  Montez MoritaKeith Paul, PA-C, was present and assisting throughout.  The patient was then taken to the PACU in stable condition.  PROGNOSIS:  The patient will continue wound treatment with hydrotherapy and dressing changes to start tomorrow, if available; otherwise, within 48 hours.  He will continue with IV antibiotics under the care of the medical service and the infectious  disease service consultants.  We will plan to see him back for evaluation in 10 to 14 days after discharge.  Because of his social habits, he remains at increased risk for persistent or recurrent infection.  LN/NUANCE  D:05/14/2018 T:05/15/2018 JOB:[id number]

## 2018-05-15 NOTE — Progress Notes (Signed)
Orthopedic Trauma Service Progress Note  Patient ID: Brett [name] MRN: [id number] DOB/AGE: 39/12/1980 39 y.o.  Subjective:  Feeling better Pain is improved Scheduled for transesophageal echo tomorrow  He is on cefepime and vancomycin currently  Gram stain shows moderate staph aureus, sensitivities are pending  He is afebrile  ROS As above  Objective:   VITALS:   Vitals:   05/14/18 1941 05/14/18 1951 05/15/18 0500 05/15/18 1130  BP: 107/75 111/68 140/84 133/81  Pulse: 72 72 88 (!) 112  Resp: 13 20 18 14   Temp:  98 F (36.7 C) (!) 97.5 F (36.4 C) 98.2 F (36.8 C)  TempSrc:  Oral Oral Oral  SpO2: 95% 100% 98% 97%  Weight:   79.4 kg   Height:        Estimated body mass index is 23.1 kg/m as calculated from the following:   Height as of this encounter: 6\' 1"  (1.854 m).   Weight as of this encounter: 79.4 kg.   Intake/Output      11/16 0701 - 11/17 0700 11/17 0701 - 11/18 0700   P.O. 300 240   I.V. (mL/kg) 500.2 (6.3)    IV Piggyback 100    Total Intake(mL/kg) 900.2 (11.3) 240 (3)   Urine (mL/kg/hr) 2300 (1.2)    Blood 10    Total Output 2310    Net -1409.8 +240        Urine Occurrence 3 x      LABS  Results for orders placed or performed during the hospital encounter of 05/12/18 (from the past 24 hour(s))  Aerobic/Anaerobic Culture (surgical/deep wound)     Status: None (Preliminary result)   Collection Time: 05/14/18  6:18 PM  Result Value Ref Range   Specimen Description ABSCESS RIGHT ANTECUBITAL    Special Requests NONE    Gram Stain      ABUNDANT WBC PRESENT, PREDOMINANTLY PMN MODERATE GRAM POSITIVE COCCI Performed at [facility], [address] [place], [place] [address]    Culture MODERATE STAPHYLOCOCCUS AUREUS    Report Status PENDING   Basic metabolic panel     Status: Abnormal   Collection Time: 05/15/18  6:52 AM  Result Value Ref Range   Sodium 135 135 -  145 mmol/L   Potassium 3.9 3.5 - 5.1 mmol/L   Chloride 100 98 - 111 mmol/L   CO2 25 22 - 32 mmol/L   Glucose, Bld 123 (H) 70 - 99 mg/dL   BUN 7 6 - 20 mg/dL   Creatinine, Ser 1.910.73 0.61 - 1.24 mg/dL   Calcium 8.6 (L) 8.9 - 10.3 mg/dL   GFR calc non Af Amer >60 >60 mL/min   GFR calc Af Amer >60 >60 mL/min   Anion gap 10 5 - 15  Vancomycin, random     Status: None   Collection Time: 05/15/18 10:46 AM  Result Value Ref Range   Vancomycin Rm 9      PHYSICAL EXAM:   Gen: Resting comfortably in bed, no acute distress Ext:       Right upper extremity  Dressing is clean, dry and intact  Moderate swelling to the hand, expected  Radial, ulnar, median, AIN, PIN motor functions are intact  Radial, ulnar, median nerve sensory functions are intact  Good color distally  Brisk capillary refill  +  Radial pulse  Assessment/Plan: 1 Day Post-Op   Active Problems:   IV drug abuse (HCC)   Septic embolism (HCC)   Abscess of antecubital fossa, right   Anti-infectives (From admission, onward)   Start     Dose/Rate Route Frequency Ordered Stop   05/14/18 0200  vancomycin (VANCOCIN) 1,250 mg in sodium chloride 0.9 % 250 mL IVPB     1,250 mg 166.7 mL/hr over 90 Minutes Intravenous Every 8 hours 05/13/18 2035     05/12/18 2100  ceFEPIme (MAXIPIME) 2 g in sodium chloride 0.9 % 100 mL IVPB     2 g 200 mL/hr over 30 Minutes Intravenous Every 8 hours 05/12/18 1206     05/12/18 2000  vancomycin (VANCOCIN) IVPB 1000 mg/200 mL premix  Status:  Discontinued     1,000 mg 200 mL/hr over 60 Minutes Intravenous Every 8 hours 05/12/18 1206 05/13/18 2035   05/12/18 1215  ceFEPIme (MAXIPIME) 2 g in sodium chloride 0.9 % 100 mL IVPB     2 g 200 mL/hr over 30 Minutes Intravenous  Once 05/12/18 1204 05/12/18 1304   05/12/18 1130  vancomycin (VANCOCIN) 1,500 mg in sodium chloride 0.9 % 500 mL IVPB     1,500 mg 250 mL/hr over 120 Minutes Intravenous  Once 05/12/18 1129 05/12/18 1504    .  POD/HD#:  22  37 year old male with history of IVDU with acute right antecubital abscess and possible septic emboli  -Right antecubital abscess s/p I&D  Clinically patient is improved  PT to start hydrotherapy tomorrow  Wound healing by secondary intention    No range of motion restrictions  Patient may use right arm as tolerated  - Medical issues   Per primary service  - ID:   Patient is on cefepime and vancomycin  Will narrow spectrum according to sensitivities  Continue per infectious disease service and medicine service  - Dispo:  Will follow along  Follow-up with orthopedics in 10 to 14 days     Mearl Latin, PA-C [phone] (C) 05/15/2018, 2:46 PM  [facility] [address] [place] [address] [phone] (F)

## 2018-05-15 NOTE — Progress Notes (Signed)
    [facility] has been requested to perform a transesophageal echocardiogram on 05/15/2018 for possible septic emboli.  After careful review of history and examination, the risks and benefits of transesophageal echocardiogram have been explained including risks of esophageal damage, perforation (1:10,000 risk), bleeding, pharyngeal hematoma as well as other potential complications associated with conscious sedation including aspiration, arrhythmia, respiratory failure and death. Alternatives to treatment were discussed, questions were answered. Patient is willing to proceed.   Patient who abuse IV drug who had bacteremia in Sept 2019 presented with R elbow antecubital abscess. CT of chest was negative for large PE, however concerning for septic emboli. Vitals stable. Red blood cell and platelet ok.  Azalee CourseHao Real Cona, PA-C 05/15/2018 1:16 PM

## 2018-05-15 NOTE — Anesthesia Postprocedure Evaluation (Signed)
Anesthesia Post Note  Patient: Salvadore Farberlan Dipasquale  Procedure(s) Performed: IRRIGATION AND DEBRIDEMENT RIGHT ANTECUBITAL ABSCESS (Right Arm Upper)     Patient location during evaluation: PACU Anesthesia Type: General Level of consciousness: awake and alert Pain management: pain level controlled Vital Signs Assessment: post-procedure vital signs reviewed and stable Respiratory status: spontaneous breathing, nonlabored ventilation, respiratory function stable and patient connected to nasal cannula oxygen Cardiovascular status: blood pressure returned to baseline and stable Postop Assessment: no apparent nausea or vomiting Anesthetic complications: no    Last Vitals:  Vitals:   05/15/18 0500 05/15/18 1130  BP: 140/84 133/81  Pulse: 88 (!) 112  Resp: 18 14  Temp: (!) 36.4 C 36.8 C  SpO2: 98% 97%    Last Pain:  Vitals:   05/15/18 1236  TempSrc:   PainSc: Asleep                 Shelton SilvasKevin D Malaysha Arlen

## 2018-05-16 ENCOUNTER — Encounter (HOSPITAL_COMMUNITY)
Admission: EM | Discharge status: Against Medical Advice | Payer: Self-pay | Source: Home / Self Care | Attending: Internal Medicine

## 2018-05-16 ENCOUNTER — Inpatient Hospital Stay (HOSPITAL_COMMUNITY): Payer: Self-pay | Admitting: Certified Registered"

## 2018-05-16 ENCOUNTER — Encounter (HOSPITAL_COMMUNITY): Payer: Self-pay | Admitting: Orthopedic Surgery

## 2018-05-16 ENCOUNTER — Inpatient Hospital Stay (HOSPITAL_COMMUNITY): Payer: Self-pay

## 2018-05-16 DIAGNOSIS — A419 Sepsis, unspecified organism: Secondary | ICD-10-CM

## 2018-05-16 DIAGNOSIS — R6521 Severe sepsis with septic shock: Secondary | ICD-10-CM

## 2018-05-16 HISTORY — PX: TEE WITHOUT CARDIOVERSION: SHX5443

## 2018-05-16 LAB — BASIC METABOLIC PANEL
ANION GAP: 8 (ref 5–15)
BUN: 8 mg/dL (ref 6–20)
CALCIUM: 8.6 mg/dL — AB (ref 8.9–10.3)
CHLORIDE: 101 mmol/L (ref 98–111)
CO2: 26 mmol/L (ref 22–32)
Creatinine, Ser: 0.77 mg/dL (ref 0.61–1.24)
GFR calc Af Amer: >60 mL/min (ref >60)
GFR calc non Af Amer: >60 mL/min (ref >60)
Glucose, Bld: 110 mg/dL — ABNORMAL HIGH (ref 70–99)
Potassium: 4.1 mmol/L (ref 3.5–5.1)
Sodium: 135 mmol/L (ref 135–145)

## 2018-05-16 LAB — VANCOMYCIN, TROUGH: Vancomycin Tr: 12 ug/mL — ABNORMAL LOW (ref 15–20)

## 2018-05-16 SURGERY — ECHOCARDIOGRAM, TRANSESOPHAGEAL
Anesthesia: Monitor Anesthesia Care

## 2018-05-16 MED ORDER — PROPOFOL 500 MG/50ML IV EMUL
INTRAVENOUS | Status: DC | PRN
  Administered 2018-05-16: 100 ug/kg/min via INTRAVENOUS

## 2018-05-16 MED ORDER — LIDOCAINE 2% (20 MG/ML) 5 ML SYRINGE
INTRAMUSCULAR | Status: DC | PRN
  Administered 2018-05-16: 100 mg via INTRAVENOUS

## 2018-05-16 MED ORDER — PROPOFOL 10 MG/ML IV BOLUS
INTRAVENOUS | Status: DC | PRN
  Administered 2018-05-16 (×2): 20 mg via INTRAVENOUS
  Administered 2018-05-16: 50 mg via INTRAVENOUS
  Administered 2018-05-16: 20 mg via INTRAVENOUS
  Administered 2018-05-16 (×2): 30 mg via INTRAVENOUS

## 2018-05-16 MED ORDER — BUTAMBEN-TETRACAINE-BENZOCAINE 2-2-14 % EX AERO
INHALATION_SPRAY | CUTANEOUS | Status: DC | PRN
  Administered 2018-05-16: 2 via TOPICAL

## 2018-05-16 NOTE — Anesthesia Postprocedure Evaluation (Signed)
Anesthesia Post Note  Patient: Brett [name]  Procedure(s) Performed: TRANSESOPHAGEAL ECHOCARDIOGRAM (TEE) (N/A ) BUBBLE STUDY     Patient location during evaluation: PACU Anesthesia Type: MAC Level of consciousness: awake and alert Pain management: pain level controlled Vital Signs Assessment: post-procedure vital signs reviewed and stable Respiratory status: spontaneous breathing, nonlabored ventilation, respiratory function stable and patient connected to nasal cannula oxygen Cardiovascular status: stable and blood pressure returned to baseline Postop Assessment: no apparent nausea or vomiting Anesthetic complications: no    Last Vitals:  Vitals:   05/16/18 1336 05/16/18 1340  BP: 118/62 (!) 119/56  Pulse: 87 80  Resp: 17 16  Temp: 36.4 C   SpO2: 97% 95%    Last Pain:  Vitals:   05/16/18 1340  TempSrc:   PainSc: 0-No pain                 Elder Davidian DAVID

## 2018-05-16 NOTE — CV Procedure (Signed)
    PROCEDURE NOTE:  Procedure:  Transesophageal echocardiogram Operator:  [name] Imre Vecchione, MD Indications:  Bacteremia with septic emboli Complications: None  During this procedure the patient is administered a total of Propofol 400 mg to achieve and maintain moderate conscious sedation.  The patient's heart rate, blood pressure, and oxygen saturation are monitored continuously during the procedure.   Results: Normal LV size and function Normal RV size and function Normal RA Normal LA Normal TV with trivial TR Normal PV with trivial PR Normal MV  Normal trileaflet AV Normal interatrial septum with no evidence of shunt by colorflow dopper  Normal thoracic and ascending aorta.  No evidence of vegetation.  The patient tolerated the procedure well and was transferred back to their room in stable condition.  Signed: [name] Danaija Eskridge, MD [facility]

## 2018-05-16 NOTE — Progress Notes (Signed)
[facility]  Date of Admission:  05/12/2018     Total days of antibiotics 6         ASSESSMENT/PLAN  [name] continues to receive treament for septic emboli and right elbow abscess. POD1 from elbow abscess I&D and doing well. Currently treating for Pseudomonas with abscess culture now positive for Staphylococcus aureus with sensitivities pending. Afebrile and tolerating vancomycin and cefepime. There is concern for vein irritation with vancomycin and midline would most likely need to be replaced with PICC. Current plan is to remain in the hospital for duration of treatment. He has no current signs/symptoms of withdrawal.   1. Continue vancomycin and cefepime.  2. Await TEE results for duration of therapy. 3. Wound / Post-surgical care per orthopedics.  4. Continue to monitor for fevers and leukocytosis.  5. Opioid use disorder per primary team   Active Problems:   IV drug abuse (HCC)   Septic embolism (HCC)   Abscess of antecubital fossa, right   . [MAR Hold] nicotine  21 mg Transdermal Daily    SUBJECTIVE:  Afebrile overnight. S/p incision and drainage of right elbow antecubital abscess with purulence obtained during the procedure. Has refused hydrotherapy. Surgical cultures positive for Staphylococcus Aureus with sensitivity pending.    Doing well today with no fevers, chills or sweats. Awaiting TEE.   Allergies  Allergen Reactions  . Penicillins Shortness Of Breath, Itching and Rash    Has patient had a PCN reaction causing immediate rash, facial/tongue/throat swelling, SOB or lightheadedness with hypotension: Yes Has patient had a PCN reaction causing severe rash involving mucus membranes or skin necrosis: Unk Has patient had a PCN reaction that required hospitalization: Unk Has patient had a PCN reaction occurring within the last 10 years: No If all of the above answers are "NO", then may proceed with Cephalosporin use.      Review  of Systems: Review of Systems  Constitutional: Negative for chills, fever, malaise/fatigue and weight loss.  Respiratory: Negative for cough, shortness of breath and wheezing.   Cardiovascular: Negative for chest pain and leg swelling.  Gastrointestinal: Negative for abdominal pain, blood in stool, constipation, diarrhea, nausea and vomiting.  Skin: Negative for rash.  Neurological: Negative for weakness and headaches.      OBJECTIVE: Vitals:   05/15/18 1953 05/16/18 0446 05/16/18 0447 05/16/18 1222  BP: 126/88  (!) 143/95 (!) 128/59  Pulse: 97  (!) 102 91  Resp: 20  20 18   Temp: 98.9 F (37.2 C)  98.3 F (36.8 C) 98.5 F (36.9 C)  TempSrc: Oral  Oral Oral  SpO2: 98%  100% 100%  Weight:  77.2 kg    Height:       Body mass index is 22.44 kg/m.  Physical Exam  Constitutional: He is oriented to person, place, and time. He appears well-developed and well-nourished. No distress.  Lying in bed; pleasant  Cardiovascular: Normal rate, regular rhythm, normal heart sounds and intact distal pulses.  Midline with dressing clean and dry.   Pulmonary/Chest: Effort normal.  Neurological: He is alert and oriented to person, place, and time.  Skin: Skin is warm and dry.  Psychiatric: He has a normal mood and affect. His behavior is normal. Judgment and thought content normal.    Lab Results Lab Results  Component Value Date   WBC 11.8 (H) 05/13/2018   HGB 12.7 (L) 05/13/2018   HCT 38.8 (L) 05/13/2018   MCV 80.3 05/13/2018   PLT 347  05/13/2018    Lab Results  Component Value Date   CREATININE 0.77 05/16/2018   BUN 8 05/16/2018   NA 135 05/16/2018   K 4.1 05/16/2018   CL 101 05/16/2018   CO2 26 05/16/2018    Lab Results  Component Value Date   ALT 22 05/13/2018   AST 14 (L) 05/13/2018   ALKPHOS 93 05/13/2018   BILITOT 0.3 05/13/2018     Microbiology: Recent Results (from the past 240 hour(s))  Blood culture (routine single)     Status: None (Preliminary result)    Collection Time: 05/12/18 11:39 AM  Result Value Ref Range Status   Specimen Description BLOOD RIGHT ARM  Final   Special Requests   Final    BOTTLES DRAWN AEROBIC AND ANAEROBIC Blood Culture adequate volume   Culture   Final    NO GROWTH 4 DAYS Performed at [facility], [address], [place], [place] [address]    Report Status PENDING  Incomplete  Blood Culture (routine x 2)     Status: None (Preliminary result)   Collection Time: 05/12/18 12:05 PM  Result Value Ref Range Status   Specimen Description BLOOD LEFT ARM  Final   Special Requests   Final    BOTTLES DRAWN AEROBIC AND ANAEROBIC Blood Culture adequate volume   Culture   Final    NO GROWTH 4 DAYS Performed at [facility], [address], [place], [place] [address]    Report Status PENDING  Incomplete  Aerobic/Anaerobic Culture (surgical/deep wound)     Status: None (Preliminary result)   Collection Time: 05/14/18  6:18 PM  Result Value Ref Range Status   Specimen Description ABSCESS RIGHT ANTECUBITAL  Final   Special Requests NONE  Final   Gram Stain   Final    ABUNDANT WBC PRESENT, PREDOMINANTLY PMN MODERATE GRAM POSITIVE COCCI Performed at [facility], [address], [place], [place] [address]    Culture MODERATE STAPHYLOCOCCUS AUREUS  Final   Report Status PENDING  Incomplete     Brett Eke, NP [facility] [phone] Pager  05/16/2018  12:29 PM

## 2018-05-16 NOTE — Interval H&P Note (Signed)
History and Physical Interval Note:  05/16/2018 12:46 PM  Brett FarberAlan Rodick  has presented today for surgery, with the diagnosis of bacteremia  The various methods of treatment have been discussed with the patient and family. After consideration of risks, benefits and other options for treatment, the patient has consented to  Procedure(s): TRANSESOPHAGEAL ECHOCARDIOGRAM (TEE) (N/A) as a surgical intervention .  The patient's history has been reviewed, patient examined, no change in status, stable for surgery.  I have reviewed the patient's chart and labs.  Questions were answered to the patient's satisfaction.     [name] 

## 2018-05-16 NOTE — Transfer of Care (Signed)
Immediate Anesthesia Transfer of Care Note  Patient: Brett [name]  Procedure(s) Performed: TRANSESOPHAGEAL ECHOCARDIOGRAM (TEE) (N/A ) BUBBLE STUDY  Patient Location: Endoscopy Unit  Anesthesia Type:MAC  Level of Consciousness: drowsy  Airway & Oxygen Therapy: Patient Spontanous Breathing and Patient connected to nasal cannula oxygen  Post-op Assessment: Report given to RN and Post -op Vital signs reviewed and stable  Post vital signs: Reviewed and stable  Last Vitals:  Vitals Value Taken Time  BP 118/62 05/16/2018  1:36 PM  Temp 36.4 C 05/16/2018  1:36 PM  Pulse 81 05/16/2018  1:36 PM  Resp 17 05/16/2018  1:36 PM  SpO2 97 % 05/16/2018  1:36 PM  Vitals shown include unvalidated device data.  Last Pain:  Vitals:   05/16/18 1336  TempSrc: Oral  PainSc: 0-No pain      Patients Stated Pain Goal: 3 (36/64/40 3474)  Complications: No apparent anesthesia complications

## 2018-05-16 NOTE — Progress Notes (Signed)
Physical Therapy Wound Treatment Patient Details  Name: Brett [name] MRN: [id number] Date of Birth: 19-Oct-1980  Today's Date: 05/16/2018 Time: 0803-0830 Time Calculation (min): 27 min  Subjective  Subjective: "You're not sticking anything in me." Patient and Family Stated Goals: Take care of his own wound "Can I just do this myself?" Prior Treatments: I&D 05/15/18  Pain Score:  Pt premedicated and appeared to tolerate well overall.   Wound Assessment  Wound / Incision (Open or Dehisced) 05/12/18  Arm Anterior;Right;Upper closed, with cut on the side of it (Active)  Wound Image   05/16/2018  8:08 AM  Dressing Type Compression wrap;Gauze (Comment);Moist to dry 05/16/2018  8:08 AM  Dressing Changed Changed 05/16/2018  8:08 AM  Dressing Status Clean;Dry;Intact 05/16/2018  8:08 AM  Dressing Change Frequency Daily 05/16/2018  8:08 AM  Site / Wound Assessment Clean;Red 05/16/2018  8:08 AM  % Wound base Red or Granulating 100% 05/16/2018  8:08 AM  Peri-wound Assessment Intact;Edema 05/16/2018  8:08 AM  Wound Length (cm) 2.2 cm 05/16/2018  8:08 AM  Wound Width (cm) 0.8 cm 05/16/2018  8:08 AM  Wound Depth (cm) Pt refused measurement   Wound Surface Area (cm^2) 1.76 cm^2 05/16/2018  8:08 AM  Tunneling (cm) Pt refused measurement 05/16/2018  8:08 AM  Undermining (cm) Pt refused measurement 05/16/2018  8:08 AM  Margins Unattached edges (unapproximated) 05/16/2018  8:08 AM  Drainage Amount Moderate 05/16/2018  8:08 AM  Drainage Description Sanguineous 05/16/2018  8:08 AM  Treatment Hydrotherapy (Pulse lavage) 05/16/2018  8:08 AM   Hydrotherapy Pulsed lavage therapy - wound location: R arm Pulsed Lavage with Suction (psi): 4 psi Pulsed Lavage with Suction - Normal Saline Used: 1000 mL Pulsed Lavage Tip: Tip with splash shield   Wound Assessment and Plan  Wound Therapy - Assess/Plan/Recommendations Wound Therapy - Clinical Statement: Pt presents to hydrotherapy with R antecubital abscess  related to IV drug use, s/p I&D 05/15/18. Pt adamantly refusing some aspects of measurement, and would not let me even attempt to pack the wound at the end of the session. Pt educated on benefits of proper dressing/packing however continued to refuse. Pt asking to do his own dressing changes and clean out the wound in the shower (pt states he did this yesterday). Deferred questions regarding this to RN. As wound is clean, pt not agreeable to most aspects of hydrotherapy treatment, and wishes to do wound care on his own, will sign off at this time.  Wound Therapy - Functional Problem List: Decreased strength and AROM in R UE related to abscess Factors Delaying/Impairing Wound Healing: Infection - systemic/local;Substance abuse Hydrotherapy Plan: Other (comment)(Will d/c from hydrotherapy services) Wound Plan: See above  Wound Therapy Goals- Improve the function of patient's integumentary system by progressing the wound(s) through the phases of wound healing (inflammation - proliferation - remodeling) by:    Goals will be updated until maximal potential achieved or discharge criteria met.  Discharge criteria: when goals achieved, discharge from hospital, MD decision/surgical intervention, no progress towards goals, refusal/missing three consecutive treatments without notification or medical reason.  GP     Thelma Comp 05/16/2018, 8:57 AM  Rolinda Roan, PT, DPT Acute Rehabilitation Services Pager: [phone] Office: [phone]

## 2018-05-16 NOTE — Plan of Care (Signed)
  Problem: Education: Goal: Knowledge of General Education information will improve Description Including pain rating scale, medication(s)/side effects and non-pharmacologic comfort measures Outcome: Progressing   Problem: Clinical Measurements: Goal: Ability to maintain clinical measurements within normal limits will improve Outcome: Progressing   Problem: Clinical Measurements: Goal: Diagnostic test results will improve Outcome: Progressing   Problem: Pain Managment: Goal: General experience of comfort will improve Outcome: Progressing   Problem: Safety: Goal: Ability to remain free from injury will improve Outcome: Progressing   Problem: Skin Integrity: Goal: Risk for impaired skin integrity will decrease Outcome: Progressing

## 2018-05-16 NOTE — Anesthesia Preprocedure Evaluation (Signed)
Anesthesia Evaluation  Patient identified by MRN, date of birth, ID band Patient awake    Reviewed: Allergy & Precautions, NPO status , Patient's Chart, lab work & pertinent test results  Airway Mallampati: I  TM Distance: >3 FB Neck ROM: Full    Dental   Pulmonary Current Smoker,    Pulmonary exam normal        Cardiovascular Normal cardiovascular exam     Neuro/Psych    GI/Hepatic (+) Hepatitis -, C  Endo/Other    Renal/GU      Musculoskeletal   Abdominal   Peds  Hematology   Anesthesia Other Findings   Reproductive/Obstetrics                             Anesthesia Physical Anesthesia Plan  ASA: III  Anesthesia Plan: MAC   Post-op Pain Management:    Induction: Intravenous  PONV Risk Score and Plan: 0 and Treatment may vary due to age or medical condition  Airway Management Planned: Simple Face Mask  Additional Equipment:   Intra-op Plan:   Post-operative Plan:   Informed Consent: I have reviewed the patients History and Physical, chart, labs and discussed the procedure including the risks, benefits and alternatives for the proposed anesthesia with the patient or authorized representative who has indicated his/her understanding and acceptance.     Plan Discussed with: CRNA and Surgeon  Anesthesia Plan Comments:         Anesthesia Quick Evaluation

## 2018-05-16 NOTE — Plan of Care (Signed)
  Problem: Activity: Goal: Risk for activity intolerance will decrease Outcome: Progressing   Problem: Nutrition: Goal: Adequate nutrition will be maintained Outcome: Progressing   Problem: Pain Managment: Goal: General experience of comfort will improve Outcome: Progressing   

## 2018-05-16 NOTE — Progress Notes (Signed)
PROGRESS NOTE  Brett [name] ZOX:[id number] DOB: 05/16/1981 DOA: 05/12/2018 PCP: Patient, No Pcp Per   LOS: 4 days   Brief Narrative / Interim history: 37 year old male with history of IV drug abuse, hep C, who was admitted to the hospital on 11/14 with dyspnea and pleuritic type chest pain.  CT scan on admission showed possible septic emboli.  He was admitted to the hospital, placed on IV Vanco and cefepime and ID was consulted.  Of note, he was evaluated in the ED in September and at that time cultures showed Pseudomonas bacteremia for which she was never fully treated.  He left AMA at one point and was on ciprofloxacin based on Pseudomonas sensitivities but it is not clear how consistently patient took this.  Subjective: -Awaiting TEE and is hungry, no chest pain, no dyspnea. No fevers or chills   Assessment & Plan: Active Problems:   IV drug abuse (HCC)   Septic embolism (HCC)   Abscess of antecubital fossa, right   Lingular pneumonia with possible septic emboli -ID consulted, appreciate input, on Vanco and cefepime -2D echo negative, TEE planned today  Right arm abscess -MRI obtained showed abscesses in the anterior soft tissues just above the elbow joint surrounding the brachial artery and veins and median nerve with adjacent myositis and cellulitis -Consulted orthopedic surgery, status post IND 11/16, preliminary results shows staph aureus, sensitivities pending -Hydrotherapy session x1 today, noncompliant with several aspects of the treatment as per PT note  IV drug use -Continue clonidine as it worked for the patient's withdrawal symptoms in the past, will give oxy intermittently for pain.  Avoid IV narcotics.  Consulted Child psychotherapistsocial worker as patient has the desire to quit drugs  Hep C -ID following, discussed with Dr. Drue SecondSnider    Scheduled Meds: . nicotine  21 mg Transdermal Daily   Continuous Infusions: . sodium chloride Stopped (05/14/18 0531)  . sodium chloride 20 mL/hr  at 05/16/18 0700  . ceFEPime (MAXIPIME) IV Stopped (05/16/18 [id number])  . lactated ringers Stopped (05/14/18 1950)  . vancomycin 1,250 mg (05/16/18 0204)   PRN Meds:.sodium chloride, acetaminophen **OR** acetaminophen, cloNIDine, ondansetron **OR** ondansetron (ZOFRAN) IV, oxyCODONE, sodium chloride flush  DVT prophylaxis: Lovenox Code Status: Full code Family Communication: Girlfriend at bedside Disposition Plan: To be determined  Consultants:   ID  Procedures:   2D echo:  Study Conclusions - Left ventricle: The cavity size was normal. Wall thickness was normal. Systolic function was normal. The estimated ejection fraction was in the range of 55% to 60%. Wall motion was normal; there were no regional wall motion abnormalities. Left ventricular diastolic function parameters were normal.  Impressions: - Normal study.    Antimicrobials:  Vancomycin 11/14 >>  Cefepime 11/14 >>   Objective: Vitals:   05/15/18 1130 05/15/18 1953 05/16/18 0446 05/16/18 0447  BP: 133/81 126/88  (!) 143/95  Pulse: (!) 112 97  (!) 102  Resp: 14 20  20   Temp: 98.2 F (36.8 C) 98.9 F (37.2 C)  98.3 F (36.8 C)  TempSrc: Oral Oral  Oral  SpO2: 97% 98%  100%  Weight:   77.2 kg   Height:        Intake/Output Summary (Last 24 hours) at 05/16/2018 0927 Last data filed at 05/16/2018 0700 Gross per 24 hour  Intake 2242.01 ml  Output -  Net 2242.01 ml   Filed Weights   05/14/18 0307 05/15/18 0500 05/16/18 0446  Weight: 80.4 kg 79.4 kg 77.2 kg    Examination:  Constitutional: NAD Respiratory: CTA Cardiovascular: RRR, no murmurs, no edema  Data Reviewed: I have independently reviewed following labs and imaging studies  CBC: Recent Labs  Lab 05/11/18 1158 05/12/18 1121 05/13/18 0554  WBC 12.4* 15.0* 11.8*  NEUTROABS 9.7* 13.9* 7.5  HGB 12.3* 14.1 12.7*  HCT 39.2 43.7 38.8*  MCV 83.9 82.5 80.3  PLT 286 284 347   Basic Metabolic Panel: Recent Labs  Lab 05/11/18 1158  05/12/18 1121 05/13/18 0554 05/15/18 0652 05/16/18 0247  NA 135 133* 137 135 135  K 4.1 4.1 3.9 3.9 4.1  CL 103 102 106 100 101  CO2 26 23 22 25 26   GLUCOSE 174* 150* 166* 123* 110*  BUN 6 8 5* 7 8  CREATININE 0.71 0.72 0.88 0.73 0.77  CALCIUM 8.5* 8.8* 8.4* 8.6* 8.6*  MG  --   --  2.0  --   --    GFR: Estimated Creatinine Clearance: 135.4 mL/min (by C-G formula based on SCr of 0.77 mg/dL). Liver Function Tests: Recent Labs  Lab 05/12/18 1121 05/13/18 0554  AST 16 14*  ALT 30 22  ALKPHOS 119 93  BILITOT <0.1* 0.3  PROT 6.4* 6.1*  ALBUMIN 2.8* 2.5*   No results for input(s): LIPASE, AMYLASE in the last 168 hours. No results for input(s): AMMONIA in the last 168 hours. Coagulation Profile: No results for input(s): INR, PROTIME in the last 168 hours. Cardiac Enzymes: No results for input(s): CKTOTAL, CKMB, CKMBINDEX, TROPONINI in the last 168 hours. BNP (last 3 results) No results for input(s): PROBNP in the last 8760 hours. HbA1C: No results for input(s): HGBA1C in the last 72 hours. CBG: Recent Labs  Lab 05/11/18 1339  GLUCAP 126*   Lipid Profile: No results for input(s): CHOL, HDL, LDLCALC, TRIG, CHOLHDL, LDLDIRECT in the last 72 hours. Thyroid Function Tests: No results for input(s): TSH, T4TOTAL, FREET4, T3FREE, THYROIDAB in the last 72 hours. Anemia Panel: No results for input(s): VITAMINB12, FOLATE, FERRITIN, TIBC, IRON, RETICCTPCT in the last 72 hours. Urine analysis:    Component Value Date/Time   COLORURINE YELLOW 03/02/2018 1227   APPEARANCEUR CLOUDY (A) 03/02/2018 1227   LABSPEC 1.019 03/02/2018 1227   PHURINE 9.0 (H) 03/02/2018 1227   GLUCOSEU NEGATIVE 03/02/2018 1227   HGBUR NEGATIVE 03/02/2018 1227   BILIRUBINUR NEGATIVE 03/02/2018 1227   KETONESUR NEGATIVE 03/02/2018 1227   PROTEINUR 30 (A) 03/02/2018 1227   NITRITE NEGATIVE 03/02/2018 1227   LEUKOCYTESUR NEGATIVE 03/02/2018 1227   Sepsis Labs: Invalid input(s): PROCALCITONIN,  LACTICIDVEN  Recent Results (from the past 240 hour(s))  Blood culture (routine single)     Status: None (Preliminary result)   Collection Time: 05/12/18 11:39 AM  Result Value Ref Range Status   Specimen Description BLOOD RIGHT ARM  Final   Special Requests   Final    BOTTLES DRAWN AEROBIC AND ANAEROBIC Blood Culture adequate volume   Culture   Final    NO GROWTH 3 DAYS Performed at [facility], [address], [place], [place] [address]    Report Status PENDING  Incomplete  Blood Culture (routine x 2)     Status: None (Preliminary result)   Collection Time: 05/12/18 12:05 PM  Result Value Ref Range Status   Specimen Description BLOOD LEFT ARM  Final   Special Requests   Final    BOTTLES DRAWN AEROBIC AND ANAEROBIC Blood Culture adequate volume   Culture   Final    NO GROWTH 3 DAYS Performed at [facility], [address] [place], [place] [address]    Report Status PENDING  Incomplete  Aerobic/Anaerobic Culture (surgical/deep wound)     Status: None (Preliminary result)   Collection Time: 05/14/18  6:18 PM  Result Value Ref Range Status   Specimen Description ABSCESS RIGHT ANTECUBITAL  Final   Special Requests NONE  Final   Gram Stain   Final    ABUNDANT WBC PRESENT, PREDOMINANTLY PMN MODERATE GRAM POSITIVE COCCI Performed at [facility], [address] [place], [place] [address]    Culture MODERATE STAPHYLOCOCCUS AUREUS  Final   Report Status PENDING  Incomplete      Radiology Studies: No results found.  Pamella Pert, MD, PhD [facility] Pager [phone]  If 7PM-7AM, please contact night-coverage [email or web address] Password [facility] 05/16/2018, 9:27 AM

## 2018-05-16 NOTE — Anesthesia Procedure Notes (Signed)
Procedure Name: MAC Date/Time: 05/16/2018 1:10 PM Performed by: Lance Coon, CRNA Pre-anesthesia Checklist: Patient identified, Emergency Drugs available, Suction available, Patient being monitored and Timeout performed Patient Re-evaluated:Patient Re-evaluated prior to induction Oxygen Delivery Method: Nasal cannula

## 2018-05-17 DIAGNOSIS — L02419 Cutaneous abscess of limb, unspecified: Secondary | ICD-10-CM

## 2018-05-17 DIAGNOSIS — B965 Pseudomonas (aeruginosa) (mallei) (pseudomallei) as the cause of diseases classified elsewhere: Secondary | ICD-10-CM

## 2018-05-17 DIAGNOSIS — A4901 Methicillin susceptible Staphylococcus aureus infection, unspecified site: Secondary | ICD-10-CM

## 2018-05-17 DIAGNOSIS — F1910 Other psychoactive substance abuse, uncomplicated: Secondary | ICD-10-CM

## 2018-05-17 DIAGNOSIS — R7881 Bacteremia: Secondary | ICD-10-CM

## 2018-05-17 LAB — BASIC METABOLIC PANEL
Anion gap: 9 (ref 5–15)
BUN: 12 mg/dL (ref 6–20)
CALCIUM: 8.8 mg/dL — AB (ref 8.9–10.3)
CHLORIDE: 104 mmol/L (ref 98–111)
CO2: 24 mmol/L (ref 22–32)
CREATININE: 0.82 mg/dL (ref 0.61–1.24)
GFR calc Af Amer: >60 mL/min (ref >60)
Glucose, Bld: 157 mg/dL — ABNORMAL HIGH (ref 70–99)
Potassium: 4.6 mmol/L (ref 3.5–5.1)
SODIUM: 137 mmol/L (ref 135–145)

## 2018-05-17 LAB — CULTURE, BLOOD (ROUTINE X 2)
Culture: NO GROWTH
Special Requests: ADEQUATE

## 2018-05-17 LAB — CULTURE, BLOOD (SINGLE)
Culture: NO GROWTH
Special Requests: ADEQUATE

## 2018-05-17 MED ORDER — DOXYCYCLINE HYCLATE 100 MG PO CAPS: 100.0000 mg | ORAL_CAPSULE | Freq: Two times a day (BID) | ORAL | 0 refills | Status: AC

## 2018-05-17 MED ORDER — CIPROFLOXACIN HCL 250 MG PO TABS: 750.0000 mg | ORAL_TABLET | Freq: Two times a day (BID) | ORAL | 0 refills | Status: AC

## 2018-05-17 NOTE — Progress Notes (Signed)
Author went to speak with patient per primary nurse request. Patient wants to remove telemetry and disconnect from IV therapy to walk around the campus. We informed the patient that he has an order for cardiac monitoring, nursing care and antibiotics and that he can not be monitored if we do not know where he is. We can not assume responsibility for his care if he is not willing to be monitored and stay on the unit. Patient said he was not staying on the unit because he was told that he can leave the unit but not the hospital. I made him aware that the information he received was not correct. At that time the patient asked to have equipment removed. Patient left the unit with his girlfriend and belongings.

## 2018-05-17 NOTE — Clinical Social Work Note (Signed)
Clinical Social Work Assessment  Patient Details  Name: Brett [name] MRN: [id number] Date of Birth: 23-Aug-1980  Date of referral:  05/17/18               Reason for consult:  Substance Use/ETOH Abuse                Permission sought to share information with:    Permission granted to share information::  No  Name::        Agency::     Relationship::     Contact Information:     Housing/Transportation Living arrangements for the past 2 months:  Single Family Home Source of Information:  Patient, Medical Team Patient Interpreter Needed:  None Criminal Activity/Legal Involvement Pertinent to Current Situation/Hospitalization:  No - Comment as needed Significant Relationships:  Spouse, Significant Other Lives with:    Do you feel safe going back to the place where you live?  Yes Need for family participation in patient care:  Yes (Comment)  Care giving concerns:  Substance abuse.   Social Worker assessment / plan:  CSW met with patient. No supports at bedside. CSW introduced role and inquired about interest in substance abuse resources. Patient confirmed. CSW provided list of inpatient and outpatient options within [place] and surrounding counties. CSW encouraged patient to review and contact CSW if he finds one he is interested in prior to discharge. No further concerns. CSW encouraged patient to contact CSW as needed. CSW will continue to follow patient for support and facilitate discharge to rehab if patient finds one he is interested in prior to discharge.  Employment status:  Unemployed Forensic scientist:  Self Pay (Medicaid Pending) PT Recommendations:  Not assessed at this time Information / Referral to community resources:  Residential Substance Abuse Treatment Options, Outpatient Substance Abuse Treatment Options  Patient/Family's Response to care:  Patient agreeable to resources. Patient's wife and girlfriend supportive and involved in patient's care. Patient appreciated  social work intervention.  Patient/Family's Understanding of and Emotional Response to Diagnosis, Current Treatment, and Prognosis:  Patient has a good understanding of the reason for admission and social work consult. Patient appears pleased with hospital care but is asking for pain medication. RN aware.  Emotional Assessment Appearance:  Appears stated age Attitude/Demeanor/Rapport:  Engaged, Gracious Affect (typically observed):  Accepting, Appropriate Orientation:  Oriented to Self, Oriented to Place, Oriented to  Time, Oriented to Situation Alcohol / Substance use:  Illicit Drugs Psych involvement (Current and /or in the community):  No (Comment)  Discharge Needs  Concerns to be addressed:  Care Coordination Readmission within the last 30 days:  No Current discharge risk:  Substance Abuse Barriers to Discharge:  Continued Medical Work up   Candie Chroman, LCSW 05/17/2018, 11:56 AM

## 2018-05-17 NOTE — Discharge Summary (Signed)
Physician AGAINST MEDICAL ADVICE discharge Summary  Joaquim Tolen OZH:[id number] DOB: 08-13-1980 DOA: 05/12/2018  PCP: Patient, No Pcp Per  Admit date: 05/12/2018 Discharge date: 05/17/2018  Admitted From: home Disposition: Left the hospital AGAINST MEDICAL ADVICE  Recommendations for Outpatient Follow-up:  1. Follow up with PCP in 1-2 weeks  Home Health: none Equipment/Devices: None  Discharge Condition: Guarded CODE STATUS: Full code Diet recommendation: Regular  HPI: Per Dr. Lajuana Ripple, Herman Fiero is a 37 y.o. male with history h/o IV drug abuse, hepatitis C who presented to ED yesterday complaining of dyspnea and pleuritic chest pain.  Patient was evaluated in the ED and underwent CT chest showing septic emboli.  ED physician discussed with infectious diseases yesterday who recommended admission with IV vancomycin/cefepime but patient refused to stay.  Given prior history of Pseudomonas infection, he was discharged with prescription for oral ciprofloxacin.  He took 1 dose last night but presented today due to worsening symptoms and new cough, productive with yellow phlegm, no hemoptysis.  He still describes pleuritic left-sided chest pain, 5/10, nonradiating and associated with cough.  Denies any associated nausea, vomiting or palpitations.  Denies any leg swellings or headache or visual changes or pain in fingertips/toes to suggest other embolic phenomenon.  He reports last use of heroin IV last night through left arm, last use of marijuana few days back.  He states he has never been in a substance rehab center but does tend to go into withdrawals. Lab work today showed WBC of 15,000, lactate of 1.5.  Patient received 1 dose of vancomycin and cefepime in the ED.  Hospital Course: For full problem based list see today's progress note In brief, patient was admitted to the hospital with lingular pneumonia was found to have possible septic emboli, and recent Pseudomonas bacteremia.  He was  placed on cefepime and infectious disease was consulted.  He was also found to have a right arm swelling, and an MRI obtained showed abscesses in the anterior soft tissues just above the elbow joint surrounding the brachial artery and veins and median nerve with adjacent myositis and cellulitis.  He was also placed on vancomycin, orthopedic surgery was consulted, and he was taken to the operating room on 11/16.  Cultures from the OR speciated MSSA.  He underwent a 2D echo which was negative for endocarditis, this was followed for a TEE by cardiology which was also negative for endocarditis.  Patient intermittently refusing care throughout his hospital stay and was asking to leave the floor, unsupervised, with his IV in, and being able to return to his room.  He is a current polysubstance abuse including IV drug abuse, and was explained at length that he cannot leave the floor with the IV in and unsupervised. Patient at that time expressed desire to leave the Hospital immidiately, patient has been warned that this is not Medically advisable at this time, and can result in Medical complications like Death and Disability, patient understands and accepts the risks involved and assumes full responsibilty of this decision.  He was given less than optimal therapy with p.o. ciprofloxacin and doxycycline on discharge for 28 days, strongly counseled to follow-up with PCP and ID before he finishes antibiotics  Discharge Diagnoses:  Principal Problem:   Abscess of antecubital fossa, right Active Problems:   IV drug abuse (HCC)   Septic embolism (HCC)   MSSA (methicillin susceptible Staphylococcus aureus) infection   Bacteremia due to Pseudomonas  Discharge Instructions  Allergies as of 05/17/2018  Reactions   Penicillins Shortness Of Breath, Itching, Rash   Has patient had a PCN reaction causing immediate rash, facial/tongue/throat swelling, SOB or lightheadedness with hypotension: Yes Has patient had a PCN  reaction causing severe rash involving mucus membranes or skin necrosis: Unk Has patient had a PCN reaction that required hospitalization: Unk Has patient had a PCN reaction occurring within the last 10 years: No If all of the above answers are "NO", then may proceed with Cephalosporin use.    Med Rec must be completed prior to using this SMARTLINK     Consultations:  ID  Procedures/Studies:  2D echo Study Conclusions - Left ventricle: The cavity size was normal. Wall thickness was normal. Systolic function was normal. The estimated ejection fraction was in the range of 55% to 60%. Wall motion was normal; there were no regional wall motion abnormalities. Left ventricular diastolic function parameters were normal.  Impressions: - Normal study.   TEE Results: Normal LV size and function Normal RV size and function Normal RA Normal LA Normal TV with trivial TR Normal PV with trivial PR Normal MV  Normal trileaflet AV Normal interatrial septum with no evidence of shunt by colorflow dopper  Normal thoracic and ascending aorta.  No evidence of vegetation.  Dg Chest 2 View  Result Date: 05/11/2018 CLINICAL DATA:  Chest pain and shortness of breath EXAM: CHEST - 2 VIEW COMPARISON:  None. FINDINGS: There is airspace consolidation in the inferior lingula. The lungs elsewhere are clear. Heart size and pulmonary vascularity are normal. No adenopathy. There is lower thoracic levoscoliosis. IMPRESSION: Inferior lingular consolidation consistent with pneumonia. Lungs elsewhere clear. No evident adenopathy. Followup PA and lateral chest radiographs recommended in 3-4 weeks following trial of antibiotic therapy to ensure resolution and exclude underlying malignancy. Electronically Signed   By: Bretta Bang III M.D.   On: 05/11/2018 12:22   Ct Angio Chest Pe W/cm &/or Wo Cm  Result Date: 05/11/2018 CLINICAL DATA:  Chest pain. IV drug use. Recent heroin use. EXAM: CT ANGIOGRAPHY  CHEST WITH CONTRAST TECHNIQUE: Multidetector CT imaging of the chest was performed using the standard protocol during bolus administration of intravenous contrast. Multiplanar CT image reconstructions and MIPs were obtained to evaluate the vascular anatomy. CONTRAST:  ISOVUE-370 IOPAMIDOL (ISOVUE-370) INJECTION 76% COMPARISON:  Chest radiograph 05/11/2018 FINDINGS: Cardiovascular: --Pulmonary arteries: Contrast injection is sufficient to demonstrate satisfactory opacification of the pulmonary arteries to the segmental level. There is no pulmonary embolus. The main pulmonary artery is within normal limits for size. --Aorta: Limited opacification of the aorta due to bolus timing optimization for the pulmonary arteries. Conventional 3 vessel aortic branching pattern. The aortic course and caliber are normal. There is no aortic atherosclerosis. --Heart: Normal size. No pericardial effusion. Mediastinum/Nodes: No mediastinal, hilar or axillary lymphadenopathy. The visualized thyroid and thoracic esophageal course are unremarkable. Lungs/Pleura: There are multiple scattered nodular opacities within both lungs. There is consolidation within the inferior lingula. No discrete cavitation. Upper Abdomen: Contrast bolus timing is not optimized for evaluation of the abdominal organs. Within this limitation, the visualized organs of the upper abdomen are normal. Musculoskeletal: No chest wall abnormality. No acute or significant osseous findings. Review of the MIP images confirms the above findings. IMPRESSION: 1. No pulmonary embolus. 2. Inferior lingula pneumonia. 3. Multiple scattered nodular opacities throughout both lungs, probably indicating septic emboli in the context of recent IV drug use. Electronically Signed   By: Deatra Robinson M.D.   On: 05/11/2018 16:01   Mr Elbow  Right Wo Contrast  Result Date: 05/13/2018 CLINICAL DATA:  Right elbow swelling and limited range of motion. IV drug abuse. EXAM: MRI OF THE  RIGHT ELBOW WITHOUT CONTRAST TECHNIQUE: Multiplanar, multisequence MR imaging of the elbow was performed. The patient declined contrast administration. COMPARISON:  Radiographs dated 05/23/2017 FINDINGS: Soft tissues: There is a 5 x 4 x 3.5 cm abscess surrounding the brachial artery and veins and median nerve arising 5 cm proximal to the elbow joint. There is abnormal edema in the brachialis muscle. There are 2 small fluid collections in the soft tissues superficial to the proximal pronator teres muscle just proximal to the medial epicondyle of the distal humerus. These are probably small abscesses as well. There is extensive subcutaneous edema in the distal upper arm and proximal forearm consistent with cellulitis. The muscle edema extends into the flexor muscles just distal to the medial epicondyle. TENDONS Common forearm flexor origin: Normal. Common forearm extensor origin: Normal. Biceps: Normal. Triceps: Normal. LIGAMENTS Medial stabilizers: Intact. Lateral stabilizers:  Intact.  A Cartilage: Normal. Joint: Minimal fluid in the elbow joint. Cubital tunnel: Normal. Bones: No osteomyelitis or other acute bone abnormality. IMPRESSION: Abscesses in the anterior soft tissues just above the elbow joint surrounding the brachial artery and veins and median nerve with adjacent myositis and cellulitis. Electronically Signed   By: Francene BoyersJames  Maxwell M.D.   On: 05/13/2018 16:15   Dg Chest Port 1 View  Result Date: 05/12/2018 CLINICAL DATA:  Pt seen yesterday and admitted for bacterial endocarditis r/t IV drug use. Pt left AMA yesterday and used heroin and fentanyl at 1900 last night. EXAM: PORTABLE CHEST 1 VIEW COMPARISON:  05/11/2018 chest x-ray and chest CT FINDINGS: There is persistent patchy opacity in the lingula. Mild perihilar infiltrate also noted. No pulmonary edema. IMPRESSION: Infiltrate in the lingula and LEFT perihilar region. Electronically Signed   By: Norva PavlovElizabeth  Brown M.D.   On: 05/12/2018 12:02        The results of significant diagnostics from this hospitalization (including imaging, microbiology, ancillary and laboratory) are listed below for reference.     Microbiology: Recent Results (from the past 240 hour(s))  Blood culture (routine single)     Status: None (Preliminary result)   Collection Time: 05/12/18 11:39 AM  Result Value Ref Range Status   Specimen Description BLOOD RIGHT ARM  Final   Special Requests   Final    BOTTLES DRAWN AEROBIC AND ANAEROBIC Blood Culture adequate volume   Culture   Final    NO GROWTH 4 DAYS Performed at [facility], [address] [place], [place] [address]    Report Status PENDING  Incomplete  Blood Culture (routine x 2)     Status: None (Preliminary result)   Collection Time: 05/12/18 12:05 PM  Result Value Ref Range Status   Specimen Description BLOOD LEFT ARM  Final   Special Requests   Final    BOTTLES DRAWN AEROBIC AND ANAEROBIC Blood Culture adequate volume   Culture   Final    NO GROWTH 4 DAYS Performed at [facility], [address] [place], [place] [address]    Report Status PENDING  Incomplete  Aerobic/Anaerobic Culture (surgical/deep wound)     Status: None (Preliminary result)   Collection Time: 05/14/18  6:18 PM  Result Value Ref Range Status   Specimen Description ABSCESS RIGHT ANTECUBITAL  Final   Special Requests NONE  Final   Gram Stain   Final    ABUNDANT WBC PRESENT,  PREDOMINANTLY PMN MODERATE GRAM POSITIVE COCCI Performed at [facility], [address] [place], [place] [address]    Culture   Final    MODERATE STAPHYLOCOCCUS AUREUS NO ANAEROBES ISOLATED; CULTURE IN PROGRESS FOR 5 DAYS    Report Status PENDING  Incomplete   Organism ID, Bacteria STAPHYLOCOCCUS AUREUS  Final      Susceptibility   Staphylococcus aureus - MIC*    CIPROFLOXACIN >=8 RESISTANT Resistant     ERYTHROMYCIN >=8 RESISTANT Resistant     GENTAMICIN <=0.5 SENSITIVE Sensitive     OXACILLIN 0.5 SENSITIVE  Sensitive     TETRACYCLINE <=1 SENSITIVE Sensitive     VANCOMYCIN <=0.5 SENSITIVE Sensitive     TRIMETH/SULFA <=10 SENSITIVE Sensitive     CLINDAMYCIN <=0.25 SENSITIVE Sensitive     RIFAMPIN <=0.5 SENSITIVE Sensitive     Inducible Clindamycin NEGATIVE Sensitive     * MODERATE STAPHYLOCOCCUS AUREUS     Labs: BNP (last 3 results) No results for input(s): BNP in the last 8760 hours. Basic Metabolic Panel: Recent Labs  Lab 05/12/18 1121 05/13/18 0554 05/15/18 0652 05/16/18 0247 05/17/18 0439  NA 133* 137 135 135 137  K 4.1 3.9 3.9 4.1 4.6  CL 102 106 100 101 104  CO2 23 22 25 26 24   GLUCOSE 150* 166* 123* 110* 157*  BUN 8 5* 7 8 12   CREATININE 0.72 0.88 0.73 0.77 0.82  CALCIUM 8.8* 8.4* 8.6* 8.6* 8.8*  MG  --  2.0  --   --   --    Liver Function Tests: Recent Labs  Lab 05/12/18 1121 05/13/18 0554  AST 16 14*  ALT 30 22  ALKPHOS 119 93  BILITOT <0.1* 0.3  PROT 6.4* 6.1*  ALBUMIN 2.8* 2.5*   No results for input(s): LIPASE, AMYLASE in the last 168 hours. No results for input(s): AMMONIA in the last 168 hours. CBC: Recent Labs  Lab 05/11/18 1158 05/12/18 1121 05/13/18 0554  WBC 12.4* 15.0* 11.8*  NEUTROABS 9.7* 13.9* 7.5  HGB 12.3* 14.1 12.7*  HCT 39.2 43.7 38.8*  MCV 83.9 82.5 80.3  PLT 286 284 347   Cardiac Enzymes: No results for input(s): CKTOTAL, CKMB, CKMBINDEX, TROPONINI in the last 168 hours. BNP: Invalid input(s): POCBNP CBG: Recent Labs  Lab 05/11/18 1339  GLUCAP 126*   D-Dimer No results for input(s): DDIMER in the last 72 hours. Hgb A1c No results for input(s): HGBA1C in the last 72 hours. Lipid Profile No results for input(s): CHOL, HDL, LDLCALC, TRIG, CHOLHDL, LDLDIRECT in the last 72 hours. Thyroid function studies No results for input(s): TSH, T4TOTAL, T3FREE, THYROIDAB in the last 72 hours.  Invalid input(s): FREET3 Anemia work up No results for input(s): VITAMINB12, FOLATE, FERRITIN, TIBC, IRON, RETICCTPCT in the last 72  hours. Urinalysis    Component Value Date/Time   COLORURINE YELLOW 03/02/2018 1227   APPEARANCEUR CLOUDY (A) 03/02/2018 1227   LABSPEC 1.019 03/02/2018 1227   PHURINE 9.0 (H) 03/02/2018 1227   GLUCOSEU NEGATIVE 03/02/2018 1227   HGBUR NEGATIVE 03/02/2018 1227   BILIRUBINUR NEGATIVE 03/02/2018 1227   KETONESUR NEGATIVE 03/02/2018 1227   PROTEINUR 30 (A) 03/02/2018 1227   NITRITE NEGATIVE 03/02/2018 1227   LEUKOCYTESUR NEGATIVE 03/02/2018 1227   Sepsis Labs Invalid input(s): PROCALCITONIN,  WBC,  LACTICIDVEN   Time coordinating discharge: 10 minutes  SIGNED:  Pamella Pert, MD  [facility] 05/17/2018, 1:53 PM Pager [phone]  If 7PM-7AM, please contact night-coverage [email or web address] Password [email or web address]

## 2018-05-17 NOTE — Progress Notes (Signed)
[facility]  Date of Admission:  05/12/2018     Total days of antibiotics 6         ASSESSMENT/PLAN  [name] continues to receive treatment for MSSA abscess of the right elbow and Psudeomonas bacteremia and currently on Day 6 of antimicrobial therapy with cefepime and vancomycin. TEE negative for endocarditis. He has been afebrile and clinically is feeling well. Plan for 2 weeks of total antimicrobial therapy with cefepime. If [name] is wishing to be discharged sooner would recommend a regimen of ciprofloxacin and doxycycline.   1. Continue current dose of cefepime with end date of 05/25/18.  2. Discontinue vancomycin.  3. Opoid use disorder per primary team.   ID will sign off and be available as needed during remainder of hospitalization.    Principal Problem:   Abscess of antecubital fossa, right Active Problems:   IV drug abuse (HCC)   Septic embolism (HCC)   MSSA (methicillin susceptible Staphylococcus aureus) infection   Bacteremia due to Pseudomonas   . nicotine  21 mg Transdermal Daily    SUBJECTIVE:  Afebrile overnight with no complications or concerns.  TEE negative for endocarditis.    Allergies  Allergen Reactions  . Penicillins Shortness Of Breath, Itching and Rash    Has patient had a PCN reaction causing immediate rash, facial/tongue/throat swelling, SOB or lightheadedness with hypotension: Yes Has patient had a PCN reaction causing severe rash involving mucus membranes or skin necrosis: Unk Has patient had a PCN reaction that required hospitalization: Unk Has patient had a PCN reaction occurring within the last 10 years: No If all of the above answers are "NO", then may proceed with Cephalosporin use.      Review of Systems: Review of Systems  Constitutional: Negative for chills, diaphoresis, fever and weight loss.  Respiratory: Negative for cough, shortness of breath and wheezing.   Cardiovascular: Negative  for chest pain and leg swelling.  Gastrointestinal: Negative for abdominal pain, diarrhea, nausea and vomiting.  Skin: Negative for rash.      OBJECTIVE: Vitals:   05/16/18 1336 05/16/18 1340 05/16/18 2006 05/17/18 0428  BP: 118/62 (!) 119/56 (!) 146/85 113/68  Pulse: 87 80 88 77  Resp: 17 16 20 20   Temp: 97.6 F (36.4 C)  98.9 F (37.2 C) 98.4 F (36.9 C)  TempSrc: Oral  Oral Oral  SpO2: 97% 95% 98% 96%  Weight:    77.9 kg  Height:       Body mass index is 22.65 kg/m.  Physical Exam  Constitutional: He is oriented to person, place, and time. He appears well-developed and well-nourished. No distress.  Cardiovascular: Normal rate, regular rhythm, normal heart sounds and intact distal pulses. Exam reveals no gallop and no friction rub.  No murmur heard. Midline IV in left upper arm with dressing that is clean and dry and no evidence of infection.  Pulmonary/Chest: Effort normal and breath sounds normal. No stridor. No respiratory distress. He has no wheezes. He has no rales.  Musculoskeletal:  Surgical dressing is clean and dry.   Neurological: He is alert and oriented to person, place, and time.  Skin: Skin is warm and dry.  Psychiatric: He has a normal mood and affect.    Lab Results Lab Results  Component Value Date   WBC 11.8 (H) 05/13/2018   HGB 12.7 (L) 05/13/2018   HCT 38.8 (L) 05/13/2018   MCV 80.3 05/13/2018   PLT 347 05/13/2018  Lab Results  Component Value Date   CREATININE 0.82 05/17/2018   BUN 12 05/17/2018   NA 137 05/17/2018   K 4.6 05/17/2018   CL 104 05/17/2018   CO2 24 05/17/2018    Lab Results  Component Value Date   ALT 22 05/13/2018   AST 14 (L) 05/13/2018   ALKPHOS 93 05/13/2018   BILITOT 0.3 05/13/2018     Microbiology: Recent Results (from the past 240 hour(s))  Blood culture (routine single)     Status: None (Preliminary result)   Collection Time: 05/12/18 11:39 AM  Result Value Ref Range Status   Specimen Description BLOOD  RIGHT ARM  Final   Special Requests   Final    BOTTLES DRAWN AEROBIC AND ANAEROBIC Blood Culture adequate volume   Culture   Final    NO GROWTH 4 DAYS Performed at [facility], [address] [place], [place] [address]    Report Status PENDING  Incomplete  Blood Culture (routine x 2)     Status: None (Preliminary result)   Collection Time: 05/12/18 12:05 PM  Result Value Ref Range Status   Specimen Description BLOOD LEFT ARM  Final   Special Requests   Final    BOTTLES DRAWN AEROBIC AND ANAEROBIC Blood Culture adequate volume   Culture   Final    NO GROWTH 4 DAYS Performed at [facility], [address], [place], [place] [address]    Report Status PENDING  Incomplete  Aerobic/Anaerobic Culture (surgical/deep wound)     Status: None (Preliminary result)   Collection Time: 05/14/18  6:18 PM  Result Value Ref Range Status   Specimen Description ABSCESS RIGHT ANTECUBITAL  Final   Special Requests NONE  Final   Gram Stain   Final    ABUNDANT WBC PRESENT, PREDOMINANTLY PMN MODERATE GRAM POSITIVE COCCI Performed at [facility], [address], [place], [place] [address]    Culture   Final    MODERATE STAPHYLOCOCCUS AUREUS NO ANAEROBES ISOLATED; CULTURE IN PROGRESS FOR 5 DAYS    Report Status PENDING  Incomplete   Organism ID, Bacteria STAPHYLOCOCCUS AUREUS  Final      Susceptibility   Staphylococcus aureus - MIC*    CIPROFLOXACIN >=8 RESISTANT Resistant     ERYTHROMYCIN >=8 RESISTANT Resistant     GENTAMICIN <=0.5 SENSITIVE Sensitive     OXACILLIN 0.5 SENSITIVE Sensitive     TETRACYCLINE <=1 SENSITIVE Sensitive     VANCOMYCIN <=0.5 SENSITIVE Sensitive     TRIMETH/SULFA <=10 SENSITIVE Sensitive     CLINDAMYCIN <=0.25 SENSITIVE Sensitive     RIFAMPIN <=0.5 SENSITIVE Sensitive     Inducible Clindamycin NEGATIVE Sensitive     * MODERATE STAPHYLOCOCCUS AUREUS     Marcos Eke, NP [facility] [phone] Pager  05/17/2018  11:44 AM

## 2018-05-17 NOTE — Progress Notes (Signed)
MD  came to talk to the pt.Pt. Left AMA.

## 2018-05-17 NOTE — Progress Notes (Signed)
PROGRESS NOTE  Brett [name] JXB:[id number] DOB: 08/21/1980 DOA: 05/12/2018 PCP: Patient, No Pcp Per   LOS: 5 days   Brief Narrative / Interim history: 37 year old male with history of IV drug abuse, hep C, who was admitted to the hospital on 11/14 with dyspnea and pleuritic type chest pain.  CT scan on admission showed possible septic emboli.  He was admitted to the hospital, placed on IV Vanco and cefepime and ID was consulted.  Of note, he was evaluated in the ED in September and at that time cultures showed Pseudomonas bacteremia for which she was never fully treated.  He left AMA at one point and was on ciprofloxacin based on Pseudomonas sensitivities but it is not clear how consistently patient took this.  Subjective: -No complaints this morning, no chest pain, no shortness of breath  Assessment & Plan: Principal Problem:   Abscess of antecubital fossa, right Active Problems:   IV drug abuse (HCC)   Septic embolism (HCC)   MSSA (methicillin susceptible Staphylococcus aureus) infection   Bacteremia due to Pseudomonas   Lingular pneumonia with possible septic emboli -ID consulted, appreciate input, on Vanco and cefepime -2D echo negative, TEE negative for endocarditis -Recommendations to follow today from ID  Recent Pseudomonas bacteremia -Cultures are now negative but he was on antibiotics as an outpatient.  Continue cefepime per ID.  Right arm abscess -MRI obtained showed abscesses in the anterior soft tissues just above the elbow joint surrounding the brachial artery and veins and median nerve with adjacent myositis and cellulitis -Consulted orthopedic surgery, status post IND 11/16, preliminary results shows MSSA, likely narrow to Ancef per ID -Hydrotherapy session x1 postop, noncompliant with several aspects of the treatment as per PT note, PT signed off, continue local dressing changes  IV drug use -Continue clonidine as it worked for the patient's withdrawal symptoms in  the past, will give oxy intermittently for pain.  Avoid IV narcotics.  Consulted Child psychotherapistsocial worker as patient has the desire to quit drugs  Hep C -ID following, discussed with Dr. Drue SecondSnider  Disposition -To be determined how long he will need intravenous antibiotics, not a candidate for PICC line due to active IV drug use, discussed with social worker regarding placement   Scheduled Meds: . nicotine  21 mg Transdermal Daily   Continuous Infusions: . sodium chloride 0 mL/hr at 05/14/18 0531  . ceFEPime (MAXIPIME) IV 2 g (05/17/18 0508)  . lactated ringers Stopped (05/14/18 1950)   PRN Meds:.sodium chloride, acetaminophen **OR** acetaminophen, cloNIDine, ondansetron **OR** ondansetron (ZOFRAN) IV, oxyCODONE, sodium chloride flush  DVT prophylaxis: Lovenox Code Status: Full code Family Communication: Girlfriend at bedside Disposition Plan: To be determined  Consultants:   ID  Procedures:   2D echo:  Study Conclusions - Left ventricle: The cavity size was normal. Wall thickness was normal. Systolic function was normal. The estimated ejection fraction was in the range of 55% to 60%. Wall motion was normal; there were no regional wall motion abnormalities. Left ventricular diastolic function parameters were normal.  Impressions: - Normal study.   Antimicrobials:  Vancomycin 11/14 >>  Cefepime 11/14 >>   Objective: Vitals:   05/16/18 1336 05/16/18 1340 05/16/18 2006 05/17/18 0428  BP: 118/62 (!) 119/56 (!) 146/85 113/68  Pulse: 87 80 88 77  Resp: 17 16 20 20   Temp: 97.6 F (36.4 C)  98.9 F (37.2 C) 98.4 F (36.9 C)  TempSrc: Oral  Oral Oral  SpO2: 97% 95% 98% 96%  Weight:    77.9  kg  Height:        Intake/Output Summary (Last 24 hours) at 05/17/2018 1046 Last data filed at 05/17/2018 1000 Gross per 24 hour  Intake 1060.38 ml  Output 950 ml  Net 110.38 ml   Filed Weights   05/15/18 0500 05/16/18 0446 05/17/18 0428  Weight: 79.4 kg 77.2 kg 77.9 kg     Examination:  Constitutional: NAD  Data Reviewed: I have independently reviewed following labs and imaging studies  CBC: Recent Labs  Lab 05/11/18 1158 05/12/18 1121 05/13/18 0554  WBC 12.4* 15.0* 11.8*  NEUTROABS 9.7* 13.9* 7.5  HGB 12.3* 14.1 12.7*  HCT 39.2 43.7 38.8*  MCV 83.9 82.5 80.3  PLT 286 284 347   Basic Metabolic Panel: Recent Labs  Lab 05/12/18 1121 05/13/18 0554 05/15/18 0652 05/16/18 0247 05/17/18 0439  NA 133* 137 135 135 137  K 4.1 3.9 3.9 4.1 4.6  CL 102 106 100 101 104  CO2 23 22 25 26 24   GLUCOSE 150* 166* 123* 110* 157*  BUN 8 5* 7 8 12   CREATININE 0.72 0.88 0.73 0.77 0.82  CALCIUM 8.8* 8.4* 8.6* 8.6* 8.8*  MG  --  2.0  --   --   --    GFR: Estimated Creatinine Clearance: 133.3 mL/min (by C-G formula based on SCr of 0.82 mg/dL). Liver Function Tests: Recent Labs  Lab 05/12/18 1121 05/13/18 0554  AST 16 14*  ALT 30 22  ALKPHOS 119 93  BILITOT <0.1* 0.3  PROT 6.4* 6.1*  ALBUMIN 2.8* 2.5*   No results for input(s): LIPASE, AMYLASE in the last 168 hours. No results for input(s): AMMONIA in the last 168 hours. Coagulation Profile: No results for input(s): INR, PROTIME in the last 168 hours. Cardiac Enzymes: No results for input(s): CKTOTAL, CKMB, CKMBINDEX, TROPONINI in the last 168 hours. BNP (last 3 results) No results for input(s): PROBNP in the last 8760 hours. HbA1C: No results for input(s): HGBA1C in the last 72 hours. CBG: Recent Labs  Lab 05/11/18 1339  GLUCAP 126*   Lipid Profile: No results for input(s): CHOL, HDL, LDLCALC, TRIG, CHOLHDL, LDLDIRECT in the last 72 hours. Thyroid Function Tests: No results for input(s): TSH, T4TOTAL, FREET4, T3FREE, THYROIDAB in the last 72 hours. Anemia Panel: No results for input(s): VITAMINB12, FOLATE, FERRITIN, TIBC, IRON, RETICCTPCT in the last 72 hours. Urine analysis:    Component Value Date/Time   COLORURINE YELLOW 03/02/2018 1227   APPEARANCEUR CLOUDY (A) 03/02/2018  1227   LABSPEC 1.019 03/02/2018 1227   PHURINE 9.0 (H) 03/02/2018 1227   GLUCOSEU NEGATIVE 03/02/2018 1227   HGBUR NEGATIVE 03/02/2018 1227   BILIRUBINUR NEGATIVE 03/02/2018 1227   KETONESUR NEGATIVE 03/02/2018 1227   PROTEINUR 30 (A) 03/02/2018 1227   NITRITE NEGATIVE 03/02/2018 1227   LEUKOCYTESUR NEGATIVE 03/02/2018 1227   Sepsis Labs: Invalid input(s): PROCALCITONIN, LACTICIDVEN  Recent Results (from the past 240 hour(s))  Blood culture (routine single)     Status: None (Preliminary result)   Collection Time: 05/12/18 11:39 AM  Result Value Ref Range Status   Specimen Description BLOOD RIGHT ARM  Final   Special Requests   Final    BOTTLES DRAWN AEROBIC AND ANAEROBIC Blood Culture adequate volume   Culture   Final    NO GROWTH 4 DAYS Performed at [facility], [address] [place], [place] [address]    Report Status PENDING  Incomplete  Blood Culture (routine x 2)     Status: None (Preliminary result)  Collection Time: 05/12/18 12:05 PM  Result Value Ref Range Status   Specimen Description BLOOD LEFT ARM  Final   Special Requests   Final    BOTTLES DRAWN AEROBIC AND ANAEROBIC Blood Culture adequate volume   Culture   Final    NO GROWTH 4 DAYS Performed at [facility], [address], [place], [place] [address]    Report Status PENDING  Incomplete  Aerobic/Anaerobic Culture (surgical/deep wound)     Status: None (Preliminary result)   Collection Time: 05/14/18  6:18 PM  Result Value Ref Range Status   Specimen Description ABSCESS RIGHT ANTECUBITAL  Final   Special Requests NONE  Final   Gram Stain   Final    ABUNDANT WBC PRESENT, PREDOMINANTLY PMN MODERATE GRAM POSITIVE COCCI Performed at [facility], [address], [place], [place] [address]    Culture   Final    MODERATE STAPHYLOCOCCUS AUREUS NO ANAEROBES ISOLATED; CULTURE IN PROGRESS FOR 5 DAYS    Report Status PENDING  Incomplete   Organism ID, Bacteria STAPHYLOCOCCUS AUREUS  Final       Susceptibility   Staphylococcus aureus - MIC*    CIPROFLOXACIN >=8 RESISTANT Resistant     ERYTHROMYCIN >=8 RESISTANT Resistant     GENTAMICIN <=0.5 SENSITIVE Sensitive     OXACILLIN 0.5 SENSITIVE Sensitive     TETRACYCLINE <=1 SENSITIVE Sensitive     VANCOMYCIN <=0.5 SENSITIVE Sensitive     TRIMETH/SULFA <=10 SENSITIVE Sensitive     CLINDAMYCIN <=0.25 SENSITIVE Sensitive     RIFAMPIN <=0.5 SENSITIVE Sensitive     Inducible Clindamycin NEGATIVE Sensitive     * MODERATE STAPHYLOCOCCUS AUREUS      Radiology Studies: No results found.  Pamella Pert, MD, PhD [facility] Pager [phone]  If 7PM-7AM, please contact night-coverage [email or web address] Password [email or web address] 05/17/2018, 10:46 AM

## 2018-05-18 ENCOUNTER — Encounter (HOSPITAL_COMMUNITY): Payer: Self-pay | Admitting: Cardiology

## 2018-05-19 LAB — AEROBIC/ANAEROBIC CULTURE W GRAM STAIN (SURGICAL/DEEP WOUND)

## 2018-05-19 LAB — AEROBIC/ANAEROBIC CULTURE (SURGICAL/DEEP WOUND)

## 2018-12-30 ENCOUNTER — Other Ambulatory Visit: Payer: Self-pay

## 2018-12-30 ENCOUNTER — Emergency Department: Payer: Self-pay

## 2018-12-30 ENCOUNTER — Inpatient Hospital Stay
Admission: EM | Admit: 2018-12-30 | Discharge: 2019-01-02 | DRG: 581 | Disposition: A | Discharge status: Home / Self Care | Payer: Self-pay | Attending: Internal Medicine | Admitting: Internal Medicine

## 2018-12-30 DIAGNOSIS — Z79891 Long term (current) use of opiate analgesic: Secondary | ICD-10-CM

## 2018-12-30 DIAGNOSIS — L02414 Cutaneous abscess of left upper limb: Principal | ICD-10-CM | POA: Diagnosis present

## 2018-12-30 DIAGNOSIS — Z716 Tobacco abuse counseling: Secondary | ICD-10-CM

## 2018-12-30 DIAGNOSIS — F1210 Cannabis abuse, uncomplicated: Secondary | ICD-10-CM | POA: Diagnosis present

## 2018-12-30 DIAGNOSIS — Z880 Allergy status to penicillin: Secondary | ICD-10-CM

## 2018-12-30 DIAGNOSIS — B1920 Unspecified viral hepatitis C without hepatic coma: Secondary | ICD-10-CM | POA: Diagnosis present

## 2018-12-30 DIAGNOSIS — M60822 Other myositis, left upper arm: Secondary | ICD-10-CM | POA: Diagnosis present

## 2018-12-30 DIAGNOSIS — F17210 Nicotine dependence, cigarettes, uncomplicated: Secondary | ICD-10-CM | POA: Diagnosis present

## 2018-12-30 DIAGNOSIS — F1410 Cocaine abuse, uncomplicated: Secondary | ICD-10-CM | POA: Diagnosis present

## 2018-12-30 DIAGNOSIS — L03114 Cellulitis of left upper limb: Secondary | ICD-10-CM | POA: Diagnosis present

## 2018-12-30 DIAGNOSIS — Z1159 Encounter for screening for other viral diseases: Secondary | ICD-10-CM

## 2018-12-30 LAB — CBC WITH DIFFERENTIAL/PLATELET
Abs Immature Granulocytes: 0.04 10*3/uL (ref 0.00–0.07)
Basophils Absolute: 0 10*3/uL (ref 0.0–0.1)
Basophils Relative: 0 %
Eosinophils Absolute: 0.3 10*3/uL (ref 0.0–0.5)
Eosinophils Relative: 2 %
HCT: 35.8 % — ABNORMAL LOW (ref 39.0–52.0)
Hemoglobin: 11.9 g/dL — ABNORMAL LOW (ref 13.0–17.0)
Immature Granulocytes: 0 %
Lymphocytes Relative: 18 %
Lymphs Abs: 2.3 10*3/uL (ref 0.7–4.0)
MCH: 28.1 pg (ref 26.0–34.0)
MCHC: 33.2 g/dL (ref 30.0–36.0)
MCV: 84.4 fL (ref 80.0–100.0)
Monocytes Absolute: 1.2 10*3/uL — ABNORMAL HIGH (ref 0.1–1.0)
Monocytes Relative: 10 %
Neutro Abs: 9.2 10*3/uL — ABNORMAL HIGH (ref 1.7–7.7)
Neutrophils Relative %: 70 %
Platelets: 224 10*3/uL (ref 150–400)
RBC: 4.24 MIL/uL (ref 4.22–5.81)
RDW: 13.2 % (ref 11.5–15.5)
WBC: 13.1 10*3/uL — ABNORMAL HIGH (ref 4.0–10.5)
nRBC: 0 % (ref 0.0–0.2)

## 2018-12-30 LAB — COMPREHENSIVE METABOLIC PANEL
ALT: 55 U/L — ABNORMAL HIGH (ref 0–44)
AST: 24 U/L (ref 15–41)
Albumin: 3.2 g/dL — ABNORMAL LOW (ref 3.5–5.0)
Alkaline Phosphatase: 116 U/L (ref 38–126)
Anion gap: 11 (ref 5–15)
BUN: 14 mg/dL (ref 6–20)
CO2: 27 mmol/L (ref 22–32)
Calcium: 8.2 mg/dL — ABNORMAL LOW (ref 8.9–10.3)
Chloride: 96 mmol/L — ABNORMAL LOW (ref 98–111)
Creatinine, Ser: 0.7 mg/dL (ref 0.61–1.24)
GFR calc Af Amer: >60 mL/min (ref >60)
GFR calc non Af Amer: >60 mL/min (ref >60)
Glucose, Bld: 175 mg/dL — ABNORMAL HIGH (ref 70–99)
Potassium: 3.6 mmol/L (ref 3.5–5.1)
Sodium: 134 mmol/L — ABNORMAL LOW (ref 135–145)
Total Bilirubin: 0.4 mg/dL (ref 0.3–1.2)
Total Protein: 6.5 g/dL (ref 6.5–8.1)

## 2018-12-30 LAB — LACTIC ACID, PLASMA: Lactic Acid, Venous: 1.7 mmol/L (ref 0.5–1.9)

## 2018-12-30 LAB — SEDIMENTATION RATE: Sed Rate: 50 mm/hr — ABNORMAL HIGH (ref 0–15)

## 2018-12-30 LAB — SARS CORONAVIRUS 2 BY RT PCR (HOSPITAL ORDER, PERFORMED IN ~~LOC~~ HOSPITAL LAB): SARS Coronavirus 2: NEGATIVE

## 2018-12-30 MED ORDER — ONDANSETRON HCL 4 MG/2ML IJ SOLN
4.0000 mg | Freq: Four times a day (QID) | INTRAMUSCULAR | Status: DC | PRN
  Administered 2018-12-31: 4 mg via INTRAVENOUS

## 2018-12-30 MED ORDER — VANCOMYCIN HCL 10 G IV SOLR
1750.0000 mg | Freq: Two times a day (BID) | INTRAVENOUS | Status: DC
  Administered 2018-12-31 – 2019-01-02 (×4): 1750 mg via INTRAVENOUS
  Filled 2018-12-30 (×7): qty 1750

## 2018-12-30 MED ORDER — SODIUM CHLORIDE 0.9 % IV SOLN
INTRAVENOUS | Status: DC
  Administered 2018-12-31 – 2019-01-01 (×3): via INTRAVENOUS

## 2018-12-30 MED ORDER — ADULT MULTIVITAMIN W/MINERALS CH
1.0000 | ORAL_TABLET | Freq: Every day | ORAL | Status: DC
  Administered 2019-01-02 (×2): 1 via ORAL
  Filled 2018-12-30 (×2): qty 1

## 2018-12-30 MED ORDER — ACETAMINOPHEN 325 MG PO TABS: 650.0000 mg | ORAL_TABLET | Freq: Four times a day (QID) | ORAL | Status: DC | PRN

## 2018-12-30 MED ORDER — METHADONE HCL 10 MG/ML PO CONC
40.0000 mg | Freq: Every day | ORAL | Status: DC
  Administered 2018-12-30: 40 mg via ORAL
  Filled 2018-12-30 (×2): qty 4

## 2018-12-30 MED ORDER — SODIUM CHLORIDE 0.9 % IV BOLUS
1000.0000 mL | Freq: Once | INTRAVENOUS | Status: AC
  Administered 2018-12-30: 1000 mL via INTRAVENOUS

## 2018-12-30 MED ORDER — ACETAMINOPHEN 650 MG RE SUPP: 650.0000 mg | Freq: Four times a day (QID) | RECTAL | Status: DC | PRN

## 2018-12-30 MED ORDER — PIPERACILLIN-TAZOBACTAM 3.375 G IVPB 30 MIN: 3.3750 g | Freq: Once | INTRAVENOUS | Status: DC

## 2018-12-30 MED ORDER — VANCOMYCIN HCL 10 G IV SOLR
2000.0000 mg | Freq: Once | INTRAVENOUS | Status: AC
  Administered 2018-12-30: 21:00:00 2000 mg via INTRAVENOUS
  Filled 2018-12-30: qty 2000

## 2018-12-30 MED ORDER — VANCOMYCIN HCL IN DEXTROSE 1-5 GM/200ML-% IV SOLN: 1000.0000 mg | Freq: Once | INTRAVENOUS | Status: DC

## 2018-12-30 MED ORDER — METHADONE HCL 10 MG PO TABS
40.0000 mg | ORAL_TABLET | Freq: Every day | ORAL | Status: DC
  Administered 2018-12-31 – 2019-01-02 (×3): 40 mg via ORAL
  Filled 2018-12-30 (×3): qty 4

## 2018-12-30 MED ORDER — ENOXAPARIN SODIUM 40 MG/0.4ML ~~LOC~~ SOLN: 40.0000 mg | SUBCUTANEOUS | Status: DC

## 2018-12-30 MED ORDER — SODIUM CHLORIDE 0.9% FLUSH
3.0000 mL | Freq: Two times a day (BID) | INTRAVENOUS | Status: DC
  Administered 2018-12-31 – 2019-01-01 (×4): 3 mL via INTRAVENOUS

## 2018-12-30 MED ORDER — POLYETHYLENE GLYCOL 3350 17 G PO PACK: 17.0000 g | PACK | Freq: Every day | ORAL | Status: DC | PRN

## 2018-12-30 MED ORDER — ONDANSETRON HCL 4 MG PO TABS: 4.0000 mg | ORAL_TABLET | Freq: Four times a day (QID) | ORAL | Status: DC | PRN

## 2018-12-30 MED ORDER — SODIUM CHLORIDE 0.9 % IV SOLN
2.0000 g | Freq: Once | INTRAVENOUS | Status: AC
  Administered 2018-12-30: 2 g via INTRAVENOUS
  Filled 2018-12-30: qty 20

## 2018-12-30 NOTE — Consult Note (Signed)
PHARMACY -  BRIEF ANTIBIOTIC NOTE   Pharmacy has received consult(s) for Vancomycin and Ceftriaxone for Cellulitis diagnosis from an ED provider.  The patient's profile has been reviewed for ht/wt/allergies/indication/available labs.    One time order(s) placed for Vancomycin 2g IV and Ceftriaxone 2g IV  Further antibiotics/pharmacy consults should be ordered by admitting physician if indicated.                       Thank you, Pearla Dubonnet 12/30/2018  8:09 PM

## 2018-12-30 NOTE — ED Notes (Signed)
Patient transported to Ultrasound 

## 2018-12-30 NOTE — ED Provider Notes (Signed)
[facility] Emergency Department Provider Note  ____________________________________________   First MD Initiated Contact with Patient 12/30/18 1921     (approximate)  I have reviewed the triage vital signs and the nursing notes.   HISTORY  Chief Complaint Abscess    HPI Brett [name] is a 38 y.o. male with past medical history of IV drug use here with left arm pain and swelling.  The patient  states that he shot up into his left AC fossa yesterday.  Over the last 24 hours, he is developed progressive worsening redness, pain, and swelling.  He said associated redness extending up his arm and down his forearm.  The pain is aching, throbbing.  It is worse with any kind movement or palpation.  He has had some subjective chills and fevers.  No other medical complaints.  Denies any recent Medication changes.  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  No other complaints.       Past Medical History:  Diagnosis Date  . Abscess of antecubital fossa, right 05/15/2018  . Hepatitis C     Patient Active Problem List   Diagnosis Date Noted  . Cellulitis of left upper extremity 12/30/2018  . MSSA (methicillin susceptible Staphylococcus aureus) infection 05/17/2018  . Pseudomonal bacteremia 05/17/2018  . Abscess of antecubital fossa, right 05/15/2018  . Septic embolism ([place]) 05/12/2018  . Cellulitis 03/02/2018  . IV drug abuse ([place]) 03/02/2018    Past Surgical History:  Procedure Laterality Date  . I&D EXTREMITY Left 03/02/2018   Procedure: IRRIGATION AND DEBRIDEMENT EXTREMITY;  Surgeon: Iran Planas, MD;  Location: [place];  Service: Orthopedics;  Laterality: Left;  . I&D EXTREMITY Right 05/14/2018   Procedure: IRRIGATION AND DEBRIDEMENT RIGHT ANTECUBITAL ABSCESS;  Surgeon: Altamese [place], MD;  Location: [place];  Service: Orthopedics;  Laterality: Right;  . TEE WITHOUT CARDIOVERSION N/A 05/16/2018   Procedure: TRANSESOPHAGEAL  ECHOCARDIOGRAM (TEE);  Surgeon: Sueanne Margarita, MD;  Location: [facility] ENDOSCOPY;  Service: Cardiovascular;  Laterality: N/A;  . TONSILLECTOMY      Prior to Admission medications   Medication Sig Start Date End Date Taking? Authorizing Provider  methadone (DOLOPHINE) 10 MG tablet Take 40 mg by mouth daily.   Yes [provider]    Allergies Penicillins  History reviewed. No pertinent family history.  Social History Social History   Tobacco Use  . Smoking status: Current Every Day Smoker    Packs/day: 1.00    Types: Cigarettes  . Smokeless tobacco: Never Used  Substance Use Topics  . Alcohol use: No    Frequency: Never  . Drug use: Yes    Types: Cocaine, Marijuana, IV    Comment: Heroin    Review of Systems  Review of Systems  Constitutional: Positive for fatigue. Negative for chills and fever.  HENT: Negative for sore throat.   Respiratory: Negative for shortness of breath.   Cardiovascular: Negative for chest pain.  Gastrointestinal: Negative for abdominal pain.  Genitourinary: Negative for flank pain.  Musculoskeletal: Negative for neck pain.  Skin: Positive for rash and wound.  Allergic/Immunologic: Negative for immunocompromised state.  Neurological: Negative for weakness and numbness.  Hematological: Does not bruise/bleed easily.     ____________________________________________  PHYSICAL EXAM:      VITAL SIGNS: ED Triage Vitals  Enc Vitals Group     BP 12/30/18 1913 138/84     Pulse Rate 12/30/18 1913 (!) 115     Resp 12/30/18 1913  18     Temp 12/30/18 1913 98.8 F (37.1 C)     Temp Source 12/30/18 1913 Oral     SpO2 12/30/18 1913 95 %     Weight 12/30/18 1912 185 lb (83.9 kg)     Height 12/30/18 1912 6\' 1"  (1.854 m)     Head Circumference --      [place] Flow --      Pain Score 12/30/18 1911 7     Pain Loc --      Pain Edu? --      Excl. in GC? --      Physical Exam Vitals signs and nursing note reviewed.  Constitutional:      General:  He is not in acute distress.    Appearance: He is well-developed.  HENT:     Head: Normocephalic and atraumatic.  Eyes:     Conjunctiva/sclera: Conjunctivae normal.  Neck:     Musculoskeletal: Neck supple.  Cardiovascular:     Rate and Rhythm: Normal rate and regular rhythm.     Heart sounds: Normal heart sounds. No murmur. No friction rub.  Pulmonary:     Effort: Pulmonary effort is normal. No respiratory distress.     Breath sounds: Normal breath sounds. No wheezing or rales.  Abdominal:     General: There is no distension.     Palpations: Abdomen is soft.     Tenderness: There is no abdominal tenderness.  Skin:    General: Skin is warm.     Capillary Refill: Capillary refill takes less than 2 seconds.  Neurological:     Mental Status: He is alert and oriented to person, place, and time.     Motor: No abnormal muscle tone.      UPPER EXTREMITY EXAM: LEFT  INSPECTION & PALPATION: Area of excoriation overlying AC fossa with marked surrounding induration, erythema, and tenderness extending from mid proximal arm to mid forearm.  SENSORY: Sensation is intact to light touch in:  Superficial radial nerve distribution (dorsal first web space) Median nerve distribution (tip of index finger)   Ulnar nerve distribution (tip of small finger)     MOTOR:  + Motor posterior interosseous nerve (thumb IP extension) + Anterior interosseous nerve (thumb IP flexion, index finger DIP flexion) + Radial nerve (wrist extension) + Median nerve (palpable firing thenar mass) + Ulnar nerve (palpable firing of first dorsal interosseous muscle)  VASCULAR: 2+ radial pulse Brisk capillary refill < 2 sec, fingers warm and well-perfused     ____________________________________________   LABS (all labs ordered are listed, but only abnormal results are displayed)  Labs Reviewed  CBC WITH DIFFERENTIAL/PLATELET - Abnormal; Notable for the following components:      Result Value   WBC 13.1 (*)     Hemoglobin 11.9 (*)    HCT 35.8 (*)    Neutro Abs 9.2 (*)    Monocytes Absolute 1.2 (*)    All other components within normal limits  COMPREHENSIVE METABOLIC PANEL - Abnormal; Notable for the following components:   Sodium 134 (*)    Chloride 96 (*)    Glucose, Bld 175 (*)    Calcium 8.2 (*)    Albumin 3.2 (*)    ALT 55 (*)    All other components within normal limits  SEDIMENTATION RATE - Abnormal; Notable for the following components:   Sed Rate 50 (*)    All other components within normal limits  SARS CORONAVIRUS 2 (HOSPITAL ORDER, [facility]FOR[facility])  CULTURE, BLOOD (ROUTINE X 2)  CULTURE, BLOOD (ROUTINE X 2)  LACTIC ACID, PLASMA  C-REACTIVE PROTEIN  LACTIC ACID, PLASMA  HEMOGLOBIN A1C  BASIC METABOLIC PANEL  CBC    ____________________________________________  EKG: None ________________________________________  RADIOLOGY All imaging, including plain films, CT scans, and ultrasounds, independently reviewed by me, and interpretations confirmed via formal radiology reads.  ED MD interpretation:   Ultrasound: No obvious or walled off abscess, there is a small subcutaneous fluid collection  Official radiology report(s): [place] Upper Extrem Ltd Soft Tissue Non Vascular  Result Date: 12/30/2018 CLINICAL DATA:  Cellulitis and swelling EXAM: ULTRASOUND left UPPER EXTREMITY LIMITED TECHNIQUE: Ultrasound examination of the upper extremity soft tissues was performed in the area of clinical concern. COMPARISON:  None FINDINGS: Targeted sonography of the left upper extremity in the region of swelling performed. This is designated as the left antecubital area. Prominent echogenic edema within the soft tissues. 2.5 x 1.3 by 1.6 cm hypoechoic area deep to the superficial soft tissues. IMPRESSION: Prominent soft tissue edema. Focal a vascular hypoechoic area in the region of swelling measuring 2.5 x 1.3 x 1.6 cm could reflect a small abscess. CT or MRI could be  obtained for further evaluation. Electronically Signed   By: Jasmine PangKim  Fujinaga M.D.   On: 12/30/2018 21:28    ____________________________________________  PROCEDURES   Procedure(s) performed (including Critical Care):  Ultrasound ED Peripheral IV (Provider)  Date/Time: 12/31/2018 1:14 AM Performed by: Shaune PollackIsaacs, Malone Admire, MD Authorized by: Shaune PollackIsaacs, Cullen Lahaie, MD   Procedure details:    Indications: multiple failed IV attempts and poor IV access     Skin Prep: chlorhexidine gluconate     Location:  Right forearm   Angiocath:  20 G   Bedside Ultrasound Guided: Yes     Images: archived     Patient tolerated procedure without complications: Yes     Dressing applied: Yes   Ultrasound ED Soft Tissue  Date/Time: 12/31/2018 1:14 AM Performed by: Shaune PollackIsaacs, Kambryn Dapolito, MD Authorized by: Shaune PollackIsaacs, Andy Moye, MD   Procedure details:    Indications: limb pain, localization of abscess and evaluate for cellulitis     Transverse view:  Visualized   Longitudinal view:  Visualized   Images: archived     Limitations:  Positioning Location:    Location: upper extremity     Side:  Left Findings:     no abscess present    cellulitis present    ____________________________________________  INITIAL IMPRESSION / MDM / ASSESSMENT AND PLAN / ED COURSE  As part of my medical decision making, I reviewed the following data within the electronic MEDICAL RECORD NUMBER Notes from prior ED visits and [place] Controlled Substance Database      *Brett [name] was evaluated in Emergency Department on 12/31/2018 for the symptoms described in the history of present illness. He was evaluated in the context of the global COVID-19 pandemic, which necessitated consideration that the patient might be at risk for infection with the SARS-CoV-2 virus that causes COVID-19. Institutional protocols and algorithms that pertain to the evaluation of patients at risk for COVID-19 are in a state of rapid change based on information released by  regulatory bodies including the CDC and federal and state organizations. These policies and algorithms were followed during the patient's care in the ED.  Some ED evaluations and interventions may be delayed as a result of limited staffing during the pandemic.*      Medical Decision Making: 38 year old male here with significant cellulitis and  very early possible abscess of left AC fossa.  There is no fluctuance on exam amenable to drainage.  Will start on IV antibiotics, plan to admit.  Bedside ultrasound performed by myself and shows no apparent discrete fluid collection.  Peripheral IV placed by myself.  ____________________________________________  FINAL CLINICAL IMPRESSION(S) / ED DIAGNOSES  Final diagnoses:  Cellulitis of left elbow     MEDICATIONS GIVEN DURING THIS VISIT:  Medications  methadone (DOLOPHINE) tablet 40 mg (has no administration in time range)  enoxaparin (LOVENOX) injection 40 mg (has no administration in time range)  sodium chloride flush (NS) 0.9 % injection 3 mL (3 mLs Intravenous Given 12/31/18 0059)  0.9 %  sodium chloride infusion ( Intravenous New Bag/Given 12/31/18 0058)  acetaminophen (TYLENOL) tablet 650 mg (has no administration in time range)    Or  acetaminophen (TYLENOL) suppository 650 mg (has no administration in time range)  polyethylene glycol (MIRALAX / GLYCOLAX) packet 17 g (has no administration in time range)  ondansetron (ZOFRAN) tablet 4 mg (has no administration in time range)    Or  ondansetron (ZOFRAN) injection 4 mg (has no administration in time range)  multivitamin with minerals tablet 1 tablet (has no administration in time range)  vancomycin (VANCOCIN) 1,750 mg in sodium chloride 0.9 % 500 mL IVPB (has no administration in time range)  ceFEPIme (MAXIPIME) 2 g in sodium chloride 0.9 % 100 mL IVPB (has no administration in time range)  metroNIDAZOLE (FLAGYL) IVPB 500 mg (has no administration in time range)  pneumococcal 23 valent  vaccine (PNU-IMMUNE) injection 0.5 mL (has no administration in time range)  oxyCODONE-acetaminophen (PERCOCET/ROXICET) 5-325 MG per tablet 1 tablet (has no administration in time range)  pantoprazole (PROTONIX) injection 40 mg (has no administration in time range)  sodium chloride 0.9 % bolus 1,000 mL (0 mLs Intravenous Stopped 12/30/18 2109)  cefTRIAXone (ROCEPHIN) 2 g in sodium chloride 0.9 % 100 mL IVPB (0 g Intravenous Stopped 12/30/18 2056)  vancomycin (VANCOCIN) 2,000 mg in sodium chloride 0.9 % 500 mL IVPB (0 mg Intravenous Stopped 12/30/18 2315)     ED Discharge Orders    None       Note:  This document was prepared using Dragon voice recognition software and may include unintentional dictation errors.   Shaune PollackIsaacs, Hyun Reali, MD 12/31/18 [id number]

## 2018-12-30 NOTE — ED Triage Notes (Signed)
Patient reports noticed abscess to left antecub on Tuesday.  Swelling noted from left hand to just pas left elbow.

## 2018-12-30 NOTE — ED Notes (Signed)
Blood culture drawn by MD Issacs, inability to draw second. Only 1 being sent at this time

## 2018-12-30 NOTE — ED Notes (Signed)
ED TO INPATIENT HANDOFF REPORT  ED Nurse Name and Phone #: Suezanne Cheshirekailey [phone]  S Name/Age/Gender Brett FarberAlan [name] 38 y.o. male Room/Bed: ED02A/ED02A  Code Status   Code Status: Full Code  Home/SNF/Other Home Patient oriented to: self, place, time and situation Is this baseline? Yes   Triage Complete: Triage complete  Chief Complaint Dog Bite  Triage Note Patient reports noticed abscess to left antecub on Tuesday.  Swelling noted from left hand to just pas left elbow.   Allergies Allergies  Allergen Reactions  . Penicillins Shortness Of Breath, Itching and Rash    Has patient had a PCN reaction causing immediate rash, facial/tongue/throat swelling, SOB or lightheadedness with hypotension: Yes Has patient had a PCN reaction causing severe rash involving mucus membranes or skin necrosis: Unk Has patient had a PCN reaction that required hospitalization: Unk Has patient had a PCN reaction occurring within the last 10 years: No If all of the above answers are "NO", then may proceed with Cephalosporin use.     Level of Care/Admitting Diagnosis ED Disposition    ED Disposition Condition Comment   Admit  [facility]ospital [facility] [[id number]]  Level of Care: Med-Surg [16]  Covid Evaluation: Confirmed COVID Negative  Diagnosis: Cellulitis of left upper extremity [[id number]][[id number]]  Admitting Physician: Pearletha AlfredSEALS, ANGELA H [[id number]]  Attending Physician: Pearletha AlfredSEALS, ANGELA H [[id number]]  Estimated length of stay: past midnight tomorrow  Certification:: I certify this patient will need inpatient services for at least 2 midnights  PT Class (Do Not Modify): Inpatient [101]  PT Acc Code (Do Not Modify): Private [1]       B Medical/Surgery History Past Medical History:  Diagnosis Date  . Abscess of antecubital fossa, right 05/15/2018  . Hepatitis C    Past Surgical History:  Procedure Laterality Date  . I&D EXTREMITY Left 03/02/2018   Procedure: IRRIGATION AND DEBRIDEMENT EXTREMITY;   Surgeon: Bradly Bienenstockrtmann, Fred, MD;  [facility];  Service: Orthopedics;  Laterality: Left;  . I&D EXTREMITY Right 05/14/2018   Procedure: IRRIGATION AND DEBRIDEMENT RIGHT ANTECUBITAL ABSCESS;  Surgeon: Myrene GalasHandy, Michael, MD;  [facility];  Service: Orthopedics;  Laterality: Right;  . TEE WITHOUT CARDIOVERSION N/A 05/16/2018   Procedure: TRANSESOPHAGEAL ECHOCARDIOGRAM (TEE);  Surgeon: Quintella Reicherturner, Traci R, MD;  [facility];  Service: Cardiovascular;  Laterality: N/A;  . TONSILLECTOMY       A IV Location/Drains/Wounds Patient Lines/Drains/Airways Status   Active Line/Drains/Airways    Name:   Placement date:   Placement time:   Site:   Days:   Peripheral IV 12/30/18 Right Arm   12/30/18    2000    Arm   less than 1   Incision (Closed) 05/14/18 Arm   05/14/18    1836     230   Wound / Incision (Open or Dehisced) 05/12/18  Arm Anterior;Right;Upper closed, with cut on the side of it   05/12/18    1707    Arm   232          Intake/Output Last 24 hours No intake or output data in the 24 hours ending 12/30/18 2344  Labs/Imaging Results for orders placed or performed during the hospital encounter of 12/30/18 (from the past 48 hour(s))  CBC with Differential     Status: Abnormal   Collection Time: 12/30/18  7:56 PM  Result Value Ref Range   WBC 13.1 (H) 4.0 - 10.5 K/uL   RBC 4.24 4.22 - 5.81 MIL/uL   Hemoglobin 11.9 (L) 13.0 -  17.0 g/dL   HCT 82.935.8 (L) 56.239.0 - 13.052.0 %   MCV 84.4 80.0 - 100.0 fL   MCH 28.1 26.0 - 34.0 pg   MCHC 33.2 30.0 - 36.0 g/dL   RDW 86.513.2 78.411.5 - 69.615.5 %   Platelets 224 150 - 400 K/uL   nRBC 0.0 0.0 - 0.2 %   Neutrophils Relative % 70 %   Neutro Abs 9.2 (H) 1.7 - 7.7 K/uL   Lymphocytes Relative 18 %   Lymphs Abs 2.3 0.7 - 4.0 K/uL   Monocytes Relative 10 %   Monocytes Absolute 1.2 (H) 0.1 - 1.0 K/uL   Eosinophils Relative 2 %   Eosinophils Absolute 0.3 0.0 - 0.5 K/uL   Basophils Relative 0 %   Basophils Absolute 0.0 0.0 - 0.1 K/uL   Immature Granulocytes 0 %    Abs Immature Granulocytes 0.04 0.00 - 0.07 K/uL    Comment: Performed at [facility], [address] [place], [place] [address]  Comprehensive metabolic panel     Status: Abnormal   Collection Time: 12/30/18  7:56 PM  Result Value Ref Range   Sodium 134 (L) 135 - 145 mmol/L   Potassium 3.6 3.5 - 5.1 mmol/L   Chloride 96 (L) 98 - 111 mmol/L   CO2 27 22 - 32 mmol/L   Glucose, Bld 175 (H) 70 - 99 mg/dL   BUN 14 6 - 20 mg/dL   Creatinine, Ser 4.130.70 0.61 - 1.24 mg/dL   Calcium 8.2 (L) 8.9 - 10.3 mg/dL   Total Protein 6.5 6.5 - 8.1 g/dL   Albumin 3.2 (L) 3.5 - 5.0 g/dL   AST 24 15 - 41 U/L   ALT 55 (H) 0 - 44 U/L   Alkaline Phosphatase 116 38 - 126 U/L   Total Bilirubin 0.4 0.3 - 1.2 mg/dL   GFR calc non Af Amer >60 >60 mL/min   GFR calc Af Amer >60 >60 mL/min   Anion gap 11 5 - 15    Comment: Performed at [facility], [address] [place], [place] [address]  Sedimentation rate     Status: Abnormal   Collection Time: 12/30/18  7:56 PM  Result Value Ref Range   Sed Rate 50 (H) 0 - 15 mm/hr    Comment: Performed at [facility], [address] [place], [place] [address]  Lactic acid, plasma     Status: None   Collection Time: 12/30/18  7:56 PM  Result Value Ref Range   Lactic Acid, Venous 1.7 0.5 - 1.9 mmol/L    Comment: Performed at [facility], [address] [place], [place] [address]  SARS Coronavirus 2 (CEPHEID - Performed in [facility]), Hosp Order     Status: None   Collection Time: 12/30/18  9:24 PM   Specimen: Nasopharyngeal Swab  Result Value Ref Range   SARS Coronavirus 2 NEGATIVE NEGATIVE    Comment: (NOTE) If result is NEGATIVE SARS-CoV-2 target nucleic acids are NOT DETECTED. The SARS-CoV-2 RNA is generally detectable in upper and lower  respiratory specimens during the acute phase of infection. The lowest  concentration of SARS-CoV-2 viral copies this assay can detect is 250  copies / mL. A negative  result does not preclude SARS-CoV-2 infection  and should not be used as the sole basis for treatment or other  patient management decisions.  A negative result may occur with  improper specimen collection / handling, submission of specimen other  than  nasopharyngeal swab, presence of viral mutation(s) within the  areas targeted by this assay, and inadequate number of viral copies  (<250 copies / mL). A negative result must be combined with clinical  observations, patient history, and epidemiological information. If result is POSITIVE SARS-CoV-2 target nucleic acids are DETECTED. The SARS-CoV-2 RNA is generally detectable in upper and lower  respiratory specimens dur ing the acute phase of infection.  Positive  results are indicative of active infection with SARS-CoV-2.  Clinical  correlation with patient history and other diagnostic information is  necessary to determine patient infection status.  Positive results do  not rule out bacterial infection or co-infection with other viruses. If result is PRESUMPTIVE POSTIVE SARS-CoV-2 nucleic acids MAY BE PRESENT.   A presumptive positive result was obtained on the submitted specimen  and confirmed on repeat testing.  While 2019 novel coronavirus  (SARS-CoV-2) nucleic acids may be present in the submitted sample  additional confirmatory testing may be necessary for epidemiological  and / or clinical management purposes  to differentiate between  SARS-CoV-2 and other Sarbecovirus currently known to infect humans.  If clinically indicated additional testing with an alternate test  methodology [phone]) is advised. The SARS-CoV-2 RNA is generally  detectable in upper and lower respiratory sp ecimens during the acute  phase of infection. The expected result is Negative. Fact Sheet for Patients:  StrictlyIdeas.no Fact Sheet for Healthcare Providers: BankingDealers.co.za This test is not yet  approved or cleared by the [place] FDA and has been authorized for detection and/or diagnosis of SARS-CoV-2 by FDA under an Emergency Use Authorization (EUA).  This EUA will remain in effect (meaning this test can be used) for the duration of the COVID-19 declaration under Section 564(b)(1) of the Act, 21 U.S.C. section 360bbb-3(b)(1), unless the authorization is terminated or revoked sooner. Performed at [facility], [address] [place], [place] [address]    [place] Lt Upper Extrem Ltd Soft Tissue Non Vascular  Result Date: 12/30/2018 CLINICAL DATA:  Cellulitis and swelling EXAM: ULTRASOUND left UPPER EXTREMITY LIMITED TECHNIQUE: Ultrasound examination of the upper extremity soft tissues was performed in the area of clinical concern. COMPARISON:  None FINDINGS: Targeted sonography of the left upper extremity in the region of swelling performed. This is designated as the left antecubital area. Prominent echogenic edema within the soft tissues. 2.5 x 1.3 by 1.6 cm hypoechoic area deep to the superficial soft tissues. IMPRESSION: Prominent soft tissue edema. Focal a vascular hypoechoic area in the region of swelling measuring 2.5 x 1.3 x 1.6 cm could reflect a small abscess. CT or MRI could be obtained for further evaluation. Electronically Signed   By: Donavan Foil M.D.   On: 12/30/2018 21:28    Pending Labs Unresulted Labs (From admission, onward)    Start     Ordered   01/06/19 0500  Creatinine, serum  (enoxaparin (LOVENOX)    CrCl >/= 30 ml/min)  Weekly,   STAT    Comments: while on enoxaparin therapy    12/30/18 2342   12/31/18 3664  Basic metabolic panel  Tomorrow morning,   STAT     12/30/18 2342   12/31/18 0500  CBC  Tomorrow morning,   STAT     12/30/18 2342   12/30/18 2343  CBC  (enoxaparin (LOVENOX)    CrCl >/= 30 ml/min)  Once,   STAT    Comments: Baseline for enoxaparin therapy IF NOT ALREADY DRAWN.  Notify MD if PLT < 100 K.  12/30/18 2342   12/30/18 2343   Creatinine, serum  (enoxaparin (LOVENOX)    CrCl >/= 30 ml/min)  Once,   STAT    Comments: Baseline for enoxaparin therapy IF NOT ALREADY DRAWN.    12/30/18 2342   12/30/18 2343  Hemoglobin A1c  Once,   STAT     12/30/18 2342   12/30/18 2001  Lactic acid, plasma  Now then [name] 2 hours,   STAT     12/30/18 2000   12/30/18 2001  Blood culture (routine x 2)  BLOOD CULTURE X 2,   STAT     12/30/18 2000   12/30/18 1958  C-reactive protein  Once,   STAT     12/30/18 1958          Vitals/Pain Today's Vitals   12/30/18 2109 12/30/18 2130 12/30/18 2156 12/30/18 2230  BP: 122/75 116/79  113/72  Pulse: 98 90  88  Resp: 16   16  Temp:      TempSrc:      SpO2: 98% 100%  97%  Weight:      Height:      PainSc:   Asleep     Isolation Precautions No active isolations  Medications Medications  methadone (DOLOPHINE) 10 MG/ML solution 40 mg (40 mg Oral Given 12/30/18 2123)  methadone (DOLOPHINE) tablet 40 mg (has no administration in time range)  piperacillin-tazobactam (ZOSYN) IVPB 3.375 g (has no administration in time range)  enoxaparin (LOVENOX) injection 40 mg (has no administration in time range)  sodium chloride flush (NS) 0.9 % injection 3 mL (has no administration in time range)  0.9 %  sodium chloride infusion (has no administration in time range)  acetaminophen (TYLENOL) tablet 650 mg (has no administration in time range)    Or  acetaminophen (TYLENOL) suppository 650 mg (has no administration in time range)  polyethylene glycol (MIRALAX / GLYCOLAX) packet 17 g (has no administration in time range)  ondansetron (ZOFRAN) tablet 4 mg (has no administration in time range)    Or  ondansetron (ZOFRAN) injection 4 mg (has no administration in time range)  multivitamin with minerals tablet 1 tablet (has no administration in time range)  sodium chloride 0.9 % bolus 1,000 mL (0 mLs Intravenous Stopped 12/30/18 2109)  cefTRIAXone (ROCEPHIN) 2 g in sodium chloride 0.9 % 100 mL IVPB (0 g  Intravenous Stopped 12/30/18 2056)  vancomycin (VANCOCIN) 2,000 mg in sodium chloride 0.9 % 500 mL IVPB (0 mg Intravenous Stopped 12/30/18 2315)    Mobility walks Low fall risk   Focused Assessments skin   R Recommendations: See Admitting Provider Note  Report given to:   Additional Notes:

## 2018-12-31 ENCOUNTER — Encounter
Admission: EM | Disposition: A | Discharge status: Home / Self Care | Payer: Self-pay | Source: Home / Self Care | Attending: Internal Medicine

## 2018-12-31 ENCOUNTER — Inpatient Hospital Stay: Payer: Self-pay | Admitting: Anesthesiology

## 2018-12-31 ENCOUNTER — Encounter: Payer: Self-pay | Admitting: *Deleted

## 2018-12-31 HISTORY — PX: INCISION AND DRAINAGE ABSCESS: SHX5864

## 2018-12-31 LAB — URINE DRUG SCREEN, QUALITATIVE (ARMC ONLY)
Amphetamines, Ur Screen: POSITIVE — AB
Barbiturates, Ur Screen: NOT DETECTED
Benzodiazepine, Ur Scrn: NOT DETECTED
Cannabinoid 50 Ng, Ur ~~LOC~~: NOT DETECTED
Cocaine Metabolite,Ur ~~LOC~~: NOT DETECTED
MDMA (Ecstasy)Ur Screen: NOT DETECTED
Methadone Scn, Ur: POSITIVE — AB
Opiate, Ur Screen: NOT DETECTED
Phencyclidine (PCP) Ur S: NOT DETECTED
Tricyclic, Ur Screen: NOT DETECTED

## 2018-12-31 LAB — CBC
HCT: 35.4 % — ABNORMAL LOW (ref 39.0–52.0)
Hemoglobin: 11.7 g/dL — ABNORMAL LOW (ref 13.0–17.0)
MCH: 28.3 pg (ref 26.0–34.0)
MCHC: 33.1 g/dL (ref 30.0–36.0)
MCV: 85.7 fL (ref 80.0–100.0)
Platelets: 209 10*3/uL (ref 150–400)
RBC: 4.13 MIL/uL — ABNORMAL LOW (ref 4.22–5.81)
RDW: 13 % (ref 11.5–15.5)
WBC: 15.5 10*3/uL — ABNORMAL HIGH (ref 4.0–10.5)
nRBC: 0 % (ref 0.0–0.2)

## 2018-12-31 LAB — BASIC METABOLIC PANEL
Anion gap: 10 (ref 5–15)
BUN: 7 mg/dL (ref 6–20)
CO2: 24 mmol/L (ref 22–32)
Calcium: 8.1 mg/dL — ABNORMAL LOW (ref 8.9–10.3)
Chloride: 100 mmol/L (ref 98–111)
Creatinine, Ser: 0.58 mg/dL — ABNORMAL LOW (ref 0.61–1.24)
GFR calc Af Amer: >60 mL/min (ref >60)
GFR calc non Af Amer: >60 mL/min (ref >60)
Glucose, Bld: 124 mg/dL — ABNORMAL HIGH (ref 70–99)
Potassium: 4.2 mmol/L (ref 3.5–5.1)
Sodium: 134 mmol/L — ABNORMAL LOW (ref 135–145)

## 2018-12-31 LAB — C-REACTIVE PROTEIN: CRP: 10.9 mg/dL — ABNORMAL HIGH (ref <1.0)

## 2018-12-31 LAB — HEMOGLOBIN A1C
Hgb A1c MFr Bld: 5.7 % — ABNORMAL HIGH (ref 4.8–5.6)
Mean Plasma Glucose: 116.89 mg/dL

## 2018-12-31 LAB — LACTIC ACID, PLASMA: Lactic Acid, Venous: 0.8 mmol/L (ref 0.5–1.9)

## 2018-12-31 SURGERY — INCISION AND DRAINAGE, ABSCESS
Anesthesia: General | Laterality: Left

## 2018-12-31 MED ORDER — MIDAZOLAM HCL 2 MG/2ML IJ SOLN
INTRAMUSCULAR | Status: DC | PRN
  Administered 2018-12-31: 2 mg via INTRAVENOUS

## 2018-12-31 MED ORDER — ONDANSETRON HCL 4 MG/2ML IJ SOLN
INTRAMUSCULAR | Status: AC
  Filled 2018-12-31: qty 2

## 2018-12-31 MED ORDER — BUPIVACAINE-EPINEPHRINE (PF) 0.5% -1:200000 IJ SOLN
INTRAMUSCULAR | Status: AC
  Filled 2018-12-31: qty 30

## 2018-12-31 MED ORDER — ONDANSETRON HCL 4 MG/2ML IJ SOLN: 4.0000 mg | Freq: Once | INTRAMUSCULAR | Status: DC | PRN

## 2018-12-31 MED ORDER — PROPOFOL 500 MG/50ML IV EMUL
INTRAVENOUS | Status: AC
  Filled 2018-12-31: qty 50

## 2018-12-31 MED ORDER — NICOTINE 21 MG/24HR TD PT24
21.0000 mg | MEDICATED_PATCH | Freq: Every day | TRANSDERMAL | Status: DC
  Filled 2018-12-31 (×3): qty 1

## 2018-12-31 MED ORDER — OXYCODONE-ACETAMINOPHEN 5-325 MG PO TABS
1.0000 | ORAL_TABLET | Freq: Four times a day (QID) | ORAL | Status: DC | PRN
  Administered 2018-12-31 – 2019-01-02 (×7): 1 via ORAL
  Filled 2018-12-31 (×7): qty 1

## 2018-12-31 MED ORDER — ENOXAPARIN SODIUM 40 MG/0.4ML ~~LOC~~ SOLN
40.0000 mg | SUBCUTANEOUS | Status: DC
  Filled 2018-12-31 (×2): qty 0.4

## 2018-12-31 MED ORDER — MIDAZOLAM HCL 2 MG/2ML IJ SOLN
INTRAMUSCULAR | Status: AC
  Filled 2018-12-31: qty 2

## 2018-12-31 MED ORDER — PROPOFOL 10 MG/ML IV BOLUS
INTRAVENOUS | Status: DC | PRN
  Administered 2018-12-31: 150 mg via INTRAVENOUS

## 2018-12-31 MED ORDER — LIDOCAINE HCL (PF) 2 % IJ SOLN
INTRAMUSCULAR | Status: AC
  Filled 2018-12-31: qty 10

## 2018-12-31 MED ORDER — METRONIDAZOLE IN NACL 5-0.79 MG/ML-% IV SOLN
500.0000 mg | Freq: Three times a day (TID) | INTRAVENOUS | Status: DC
  Administered 2018-12-31 – 2019-01-02 (×7): 500 mg via INTRAVENOUS
  Filled 2018-12-31 (×10): qty 100

## 2018-12-31 MED ORDER — FENTANYL CITRATE (PF) 100 MCG/2ML IJ SOLN
25.0000 ug | INTRAMUSCULAR | Status: DC | PRN
  Administered 2018-12-31 (×4): 25 ug via INTRAVENOUS

## 2018-12-31 MED ORDER — PNEUMOCOCCAL VAC POLYVALENT 25 MCG/0.5ML IJ INJ
0.5000 mL | INJECTION | INTRAMUSCULAR | Status: DC
  Filled 2018-12-31 (×2): qty 0.5

## 2018-12-31 MED ORDER — FENTANYL CITRATE (PF) 100 MCG/2ML IJ SOLN
INTRAMUSCULAR | Status: AC
  Filled 2018-12-31: qty 2

## 2018-12-31 MED ORDER — SODIUM CHLORIDE 0.9 % IV SOLN
2.0000 g | Freq: Three times a day (TID) | INTRAVENOUS | Status: DC
  Administered 2018-12-31 – 2019-01-02 (×7): 2 g via INTRAVENOUS
  Filled 2018-12-31 (×9): qty 2

## 2018-12-31 MED ORDER — LIDOCAINE HCL (CARDIAC) PF 100 MG/5ML IV SOSY
PREFILLED_SYRINGE | INTRAVENOUS | Status: DC | PRN
  Administered 2018-12-31: 100 mg via INTRAVENOUS

## 2018-12-31 MED ORDER — GLYCOPYRROLATE 0.2 MG/ML IJ SOLN
INTRAMUSCULAR | Status: AC
  Filled 2018-12-31: qty 1

## 2018-12-31 MED ORDER — PANTOPRAZOLE SODIUM 40 MG IV SOLR
40.0000 mg | INTRAVENOUS | Status: DC
  Administered 2018-12-31 – 2019-01-02 (×3): 40 mg via INTRAVENOUS
  Filled 2018-12-31 (×3): qty 40

## 2018-12-31 MED ORDER — GLYCOPYRROLATE 0.2 MG/ML IJ SOLN
INTRAMUSCULAR | Status: DC | PRN
  Administered 2018-12-31: 0.2 mg via INTRAVENOUS

## 2018-12-31 MED ORDER — BUPIVACAINE-EPINEPHRINE (PF) 0.5% -1:200000 IJ SOLN
INTRAMUSCULAR | Status: DC | PRN
  Administered 2018-12-31: 30 mL via PERINEURAL

## 2018-12-31 MED ORDER — FENTANYL CITRATE (PF) 100 MCG/2ML IJ SOLN
INTRAMUSCULAR | Status: DC | PRN
  Administered 2018-12-31: 100 ug via INTRAVENOUS

## 2018-12-31 SURGICAL SUPPLY — 25 items
BLADE SURG 15 STRL LF DISP TIS (BLADE) ×1 IMPLANT
BLADE SURG 15 STRL SS (BLADE) ×2
CANISTER SUCT 1200ML W/VALVE (MISCELLANEOUS) ×3 IMPLANT
CHLORAPREP W/TINT 26 (MISCELLANEOUS) ×3 IMPLANT
COVER WAND RF STERILE (DRAPES) ×3 IMPLANT
DRAPE LAPAROTOMY 100X77 ABD (DRAPES) ×3 IMPLANT
ELECT REM PT RETURN 9FT ADLT (ELECTROSURGICAL) ×3
ELECTRODE REM PT RTRN 9FT ADLT (ELECTROSURGICAL) ×1 IMPLANT
GLOVE BIOGEL PI IND STRL 7.0 (GLOVE) ×1 IMPLANT
GLOVE BIOGEL PI INDICATOR 7.0 (GLOVE) ×2
GLOVE SURG SYN 6.5 ES PF (GLOVE) ×6 IMPLANT
GOWN STRL REUS W/ TWL LRG LVL3 (GOWN DISPOSABLE) ×2 IMPLANT
GOWN STRL REUS W/TWL LRG LVL3 (GOWN DISPOSABLE) ×4
LABEL OR SOLS (LABEL) ×3 IMPLANT
NEEDLE HYPO 22GX1.5 SAFETY (NEEDLE) ×3 IMPLANT
NS IRRIG 500ML POUR BTL (IV SOLUTION) ×3 IMPLANT
PACK BASIN MINOR ARMC (MISCELLANEOUS) ×3 IMPLANT
SUT MNCRL 4-0 (SUTURE) ×2
SUT MNCRL 4-0 27XMFL (SUTURE) ×1
SUT VIC AB 2-0 CT2 27 (SUTURE) ×3 IMPLANT
SUT VIC AB 3-0 SH 27 (SUTURE) ×2
SUT VIC AB 3-0 SH 27X BRD (SUTURE) ×1 IMPLANT
SUTURE MNCRL 4-0 27XMF (SUTURE) ×1 IMPLANT
SYR 10ML LL (SYRINGE) ×3 IMPLANT
TOWEL OR 17X26 4PK STRL BLUE (TOWEL DISPOSABLE) ×3 IMPLANT

## 2018-12-31 NOTE — Anesthesia Post-op Follow-up Note (Signed)
Anesthesia QCDR form completed.        

## 2018-12-31 NOTE — Anesthesia Postprocedure Evaluation (Signed)
Anesthesia Post Note  Patient: Brett [name]  Procedure(s) Performed: INCISION AND DRAINAGE ABSCESS (Left )  Patient location during evaluation: PACU Anesthesia Type: General Level of consciousness: awake and alert Pain management: pain level controlled Vital Signs Assessment: post-procedure vital signs reviewed and stable Respiratory status: spontaneous breathing, nonlabored ventilation, respiratory function stable and patient connected to nasal cannula oxygen Cardiovascular status: blood pressure returned to baseline and stable Postop Assessment: no apparent nausea or vomiting Anesthetic complications: no     Last Vitals:  Vitals:   12/31/18 2105 12/31/18 2111  BP:  138/76  Pulse: 92 89  Resp: 20 16  Temp:  37.2 C  SpO2: 96% 99%    Last Pain:  Vitals:   12/31/18 2111  TempSrc:   PainSc: 2                  Teana Lindahl S

## 2018-12-31 NOTE — H&P (Addendum)
[facility] [place] [facility]   PATIENT NAME: Brett [name] Ceja    MR#:  [id number]  DATE OF BIRTH:  05/15/1981  DATE OF ADMISSION:  12/30/2018  PRIMARY CARE PHYSICIAN: Patient, No Pcp Per   REQUESTING/REFERRING PHYSICIAN: Shaune Pollackameron Isaacs, MD  CHIEF COMPLAINT:   Chief Complaint  Patient presents with  . Abscess    HISTORY OF PRESENT ILLNESS:  Brett [name] Schlereth  is a 38 y.o. male with a known history of IV drug abuse and hepatitis C.  He presented to the emergency room complaining of left upper extremity edema and pain becoming recently worse over the last 4 days however pain and edema became significantly worse over the last 24 hours.  There is an area of erythema in his left AC area however no evidence of drainage.  He has noted subjective fevers however no chills.  He denies nausea, vomiting, diarrhea.  He denies abdominal pain.  He denies chest pain or shortness of breath.  He admits to ongoing IV drug abuse with cocaine.  He also admits to marijuana and tobacco abuse.  Historically patient has been treated for pseudomonal bacteremia and septic embolism.  Right upper extremity ultrasound was performed demonstrating possible small abscess measuring 2.5 x 1.3 x 1.6 cm.  WBC is 13.1 with sed rate 50.  Lactic acid is 1.7.  Blood cultures are pending.  He was started on IV vancomycin and Rocephin in the emergency room.  He received initial 1 L normal saline bolus.  Rapid COVID-19 testing is negative  He has been admitted to the hospitalist service for further management.  PAST MEDICAL HISTORY:   Past Medical History:  Diagnosis Date  . Abscess of antecubital fossa, right 05/15/2018  . Hepatitis C     PAST SURGICAL HISTORY:   Past Surgical History:  Procedure Laterality Date  . I&D EXTREMITY Left 03/02/2018   Procedure: IRRIGATION AND DEBRIDEMENT EXTREMITY;  Surgeon: Bradly Bienenstockrtmann, Fred, MD;  [facility];  Service: Orthopedics;  Laterality: Left;  . I&D EXTREMITY Right  05/14/2018   Procedure: IRRIGATION AND DEBRIDEMENT RIGHT ANTECUBITAL ABSCESS;  Surgeon: Myrene GalasHandy, Michael, MD;  [facility];  Service: Orthopedics;  Laterality: Right;  . TEE WITHOUT CARDIOVERSION N/A 05/16/2018   Procedure: TRANSESOPHAGEAL ECHOCARDIOGRAM (TEE);  Surgeon: Quintella Reicherturner, Traci R, MD;  [facility];  Service: Cardiovascular;  Laterality: N/A;  . TONSILLECTOMY      SOCIAL HISTORY:   Social History   Tobacco Use  . Smoking status: Current Every Day Smoker    Packs/day: 1.00    Types: Cigarettes  . Smokeless tobacco: Never Used  Substance Use Topics  . Alcohol use: No    Frequency: Never    FAMILY HISTORY:  History reviewed. No pertinent family history.  DRUG ALLERGIES:   Allergies  Allergen Reactions  . Penicillins Shortness Of Breath, Itching and Rash    Has patient had a PCN reaction causing immediate rash, facial/tongue/throat swelling, SOB or lightheadedness with hypotension: Yes Has patient had a PCN reaction causing severe rash involving mucus membranes or skin necrosis: Unk Has patient had a PCN reaction that required hospitalization: Unk Has patient had a PCN reaction occurring within the last 10 years: No If all of the above answers are "NO", then may proceed with Cephalosporin use.     REVIEW OF SYSTEMS:   Review of Systems  Constitutional: Positive for fever and malaise/fatigue. Negative for diaphoresis.  HENT: Negative for congestion and sore throat.   Eyes: Negative for blurred  vision and pain.  Respiratory: Negative for cough, shortness of breath and wheezing.   Cardiovascular: Negative for chest pain and palpitations.  Gastrointestinal: Negative for abdominal pain, constipation, diarrhea, heartburn, nausea and vomiting.  Genitourinary: Negative for dysuria, flank pain and hematuria.  Musculoskeletal: Negative for falls and joint pain.  Skin: Negative for itching and rash.       Left upper extremity pain and edema  Neurological:  Negative for dizziness, sensory change, focal weakness, weakness and headaches.  Psychiatric/Behavioral: Positive for substance abuse (Cocaine and marijuana). Negative for depression.      MEDICATIONS AT HOME:   Prior to Admission medications   Medication Sig Start Date End Date Taking? Authorizing Provider  methadone (DOLOPHINE) 10 MG tablet Take 40 mg by mouth daily.   Yes [provider]      VITAL SIGNS:  Blood pressure 129/76, pulse 83, temperature 98.2 F (36.8 C), temperature source Oral, resp. rate 16, height 6\' 1"  (1.854 m), weight 85.7 kg, SpO2 95 %.  PHYSICAL EXAMINATION:  Physical Exam  GENERAL:  38 y.o.-year-old patient lying in the bed with no acute distress.  EYES: Pupils equal, round, reactive to light and accommodation. No scleral icterus. Extraocular muscles intact.  HEENT: Head atraumatic, normocephalic. Oropharynx and nasopharynx clear.  NECK:  Supple, no jugular venous distention. No thyroid enlargement, no tenderness.  LUNGS: Normal breath sounds bilaterally, bilateral expiratory wheezes, no rales or rhonchi. No use of accessory muscles of respiration.  CARDIOVASCULAR: Regular rate and rhythm, S1, S2 normal. No murmurs, rubs, or gallops.  ABDOMEN: Soft, nondistended, nontender. Bowel sounds present. No organomegaly or mass.  EXTREMITIES: No pedal edema, cyanosis, or clubbing.  Left upper extremity firm edema extending from the forearm to the axillary area with erythema in the area of the left [facility]: Cranial nerves II through XII are intact. Muscle strength 5/5 in all extremities. Sensation intact. Gait not checked.  PSYCHIATRIC: The patient is alert and oriented x 3.  Normal affect and good eye contact. SKIN: Left upper extremity as above  LABORATORY PANEL:   CBC Recent Labs  Lab 12/30/18 1956  WBC 13.1*  HGB 11.9*  HCT 35.8*  PLT 224    ------------------------------------------------------------------------------------------------------------------  Chemistries  Recent Labs  Lab 12/30/18 1956  NA 134*  K 3.6  CL 96*  CO2 27  GLUCOSE 175*  BUN 14  CREATININE 0.70  CALCIUM 8.2*  AST 24  ALT 55*  ALKPHOS 116  BILITOT 0.4   ------------------------------------------------------------------------------------------------------------------  Cardiac Enzymes No results for input(s): TROPONINI in the last 168 hours. ------------------------------------------------------------------------------------------------------------------  RADIOLOGY:  [place] Lt Upper Extrem Ltd Soft Tissue Non Vascular  Result Date: 12/30/2018 CLINICAL DATA:  Cellulitis and swelling EXAM: ULTRASOUND left UPPER EXTREMITY LIMITED TECHNIQUE: Ultrasound examination of the upper extremity soft tissues was performed in the area of clinical concern. COMPARISON:  None FINDINGS: Targeted sonography of the left upper extremity in the region of swelling performed. This is designated as the left antecubital area. Prominent echogenic edema within the soft tissues. 2.5 x 1.3 by 1.6 cm hypoechoic area deep to the superficial soft tissues. IMPRESSION: Prominent soft tissue edema. Focal a vascular hypoechoic area in the region of swelling measuring 2.5 x 1.3 x 1.6 cm could reflect a small abscess. CT or MRI could be obtained for further evaluation. Electronically Signed   By: Jasmine PangKim  Fujinaga M.D.   On: 12/30/2018 21:28      IMPRESSION AND PLAN:   1. left upper extremity cellulitis - He received IV  vancomycin and Rocephin in the emergency room.  He has been continued on vancomycin with Zosyn. --1 L IV normal saline bolus initiated in the emergency room.  Normal saline infusing to peripheral IV at 100 cc/h. - Blood culture results are pending - We will repeat CBC and BMP in the a.m. - No area of fluctuance present at this time.  Patient may need surgical consult for  I&D if no improvement or if area for potential abscess drainage present.  2.  History of IV drug abuse - Methadone prescription confirmed by the ED physician and continued. - We have discussed the importance of abstinence from IV drug abuse particularly given patient's history of related infections and hospitalizations.  3. left upper extremity pain --Pain is being managed with analgesic  4.  Tobacco abuse -Tobacco cessation counseling  DVT and PPI prophylaxis initiated   All the records are reviewed and case discussed with ED provider. The plan of care was discussed in details with the patient (and family). I answered all questions. The patient agreed to proceed with the above mentioned plan. Further management will depend upon hospital course.   CODE STATUS: Full code  TOTAL TIME TAKING CARE OF THIS PATIENT: 33minutes.    Armstrong on 12/31/2018 at 12:44 AM  Pager - [phone]  After 6pm go to [email or web address] - Proofreader  [facility] [place] [facility]  Office  [phone]  CC: Primary care physician; Patient, No Pcp Per   Note: This dictation was prepared with Dragon dictation along with smaller phrase technology. Any transcriptional errors that result from this process are unintentional.  Patient seen and evaluated by me today Has redness swelling in the left upper arm and left elbow and left forearm Needs IV antibiotics and drainage Surgery consult Broad-spectrum IV antibiotics Tobacco cessation counseled to the patient I agree with physical exam and treatment plan by NP Gardiner Barefoot

## 2018-12-31 NOTE — Progress Notes (Signed)
Pharmacy Antibiotic Note  Brett [name] is a 38 y.o. male admitted on 12/30/2018 with cellulitis.  Pharmacy has been consulted for vanc/cefepime/flagyl dosing.   Plan: Patient received vanc 2g IV load and ceftriaxone 2g IV x 1  Vancomycin 1750 mg IV Q 12 hrs. Goal AUC 400-550. Expected AUC: 474.6 mcg*h/mL SCr used: 0.8 mg/dL Cssmin: 11.3 mcg/mL  Will start cefepime 2g IV q8h and flagyl 500 mg IV q8h will continue to monitor renal function and clinical course of abscess, s/sx of infx.  Height: 6\' 1"  (185.4 cm) Weight: 188 lb 15 oz (85.7 kg) IBW/kg (Calculated) : 79.9  Temp (24hrs), Avg:98.5 F (36.9 C), Min:98.2 F (36.8 C), Max:98.8 F (37.1 C)  Recent Labs  Lab 12/30/18 1956  WBC 13.1*  CREATININE 0.70  LATICACIDVEN 1.7    Estimated Creatinine Clearance: 138.7 mL/min (by C-G formula based on SCr of 0.7 mg/dL).    Allergies  Allergen Reactions  . Penicillins Shortness Of Breath, Itching and Rash    Has patient had a PCN reaction causing immediate rash, facial/tongue/throat swelling, SOB or lightheadedness with hypotension: Yes Has patient had a PCN reaction causing severe rash involving mucus membranes or skin necrosis: Unk Has patient had a PCN reaction that required hospitalization: Unk Has patient had a PCN reaction occurring within the last 10 years: No If all of the above answers are "NO", then may proceed with Cephalosporin use.     Thank you for allowing pharmacy to be a part of this patient's care.  Tobie Lords, PharmD, BCPS Clinical Pharmacist 12/31/2018 1:16 AM

## 2018-12-31 NOTE — Op Note (Addendum)
Preoperative diagnosis: left arm abscess Postoperative diagnosis: same  Procedure: Incision and drainage of left arm abscess  Anesthesia: LMA  Surgeon: Lysle Pearl  Wound Classification: Contaminated  Indications: Patient is a 38 y.o. male  presented with above.  See H&P for further details.  Specimen: left arm abscess  Complications: None  Estimated Blood Loss: 28mL  Findings:  1. Large pocket of purulence adjacent to vasculature within left antecubital fossa 2. purulent secretions drained and cultured 3. Adequate hemostasis.   Description of procedure: The patient was placed in the supine position and LMA anesthesia was induced. The area was prepped and draped in the usual sterile fashion. A timeout was completed verifying correct patient, procedure, site, positioning, and implant(s) and/or special equipment prior to beginning this procedure.  Local infused over planned incision site.  Sharp Inicision made through skin over most indurated portion over medial aspect of antecubital fossa and explosive amount of purulent secretions drained. Cultures taken.  With a hemostat blunt dissection of septas performed to drain the abscess completely.  Wound extended in a cruciate fashion to ensure entire area and tunneling was exposed.  The wound then irrigated, purulent secretions drained completely, and hemostasis confirmed. The 4cm x 4cm x 3cm deep incision site down to fascia with palpable brachial artery at base was then packed with surgicell for additional hemostasis, then packed with 4x4, and secured in place with kerlex roll  The patient tolerated the procedure well and was taken to the postanesthesia care unit in satisfactory condition. Sponge, instrument count correct at end of procedure.

## 2018-12-31 NOTE — Consult Note (Addendum)
Subjective:   CC: Left antecubital fossa abscess  HPI:  Brett [name] is a 38 y.o. male who was consulted by Pyreddy for evaluation of above.  First noted a few days ago.  Symptoms include: Pain is dull, radiating from the elbow proximally and distally to the area of swelling.  Exacerbated by palpation and flexion of the elbow.  Alleviated by nothing specific.  Associated with increased swelling proximal and distal through the arm.  Started noticing swelling and pain after injection of heroin and meth.  Patient has multiple past histories of such behavior requiring I&D's in the past.   Past Medical History:  has a past medical history of Abscess of antecubital fossa, right (05/15/2018) and Hepatitis C.  Past Surgical History:  has a past surgical history that includes Tonsillectomy; I&D extremity (Left, 03/02/2018); I&D extremity (Right, 05/14/2018); and TEE without cardioversion (N/A, 05/16/2018).  Family History: family history is not on file.  Social History:  reports that he has been smoking cigarettes. He has been smoking about 1.00 pack per day. He has never used smokeless tobacco. He reports current drug use. Drugs: Cocaine, Marijuana, and IV. He reports that he does not drink alcohol.  Current Medications:  Medications Prior to Admission  Medication Sig Dispense Refill  . methadone (DOLOPHINE) 10 MG tablet Take 40 mg by mouth daily.      Allergies:  Allergies  Allergen Reactions  . Penicillins Shortness Of Breath, Itching and Rash    Has patient had a PCN reaction causing immediate rash, facial/tongue/throat swelling, SOB or lightheadedness with hypotension: Yes Has patient had a PCN reaction causing severe rash involving mucus membranes or skin necrosis: Unk Has patient had a PCN reaction that required hospitalization: Unk Has patient had a PCN reaction occurring within the last 10 years: No If all of the above answers are "NO", then may proceed with Cephalosporin use.      ROS:  General: Denies weight loss, weight gain, fatigue, fevers, chills, and night sweats. Eyes: Denies blurry vision, double vision, eye pain, itchy eyes, and tearing. Ears: Denies hearing loss, earache, and ringing in ears. Nose: Denies sinus pain, congestion, infections, runny nose, and nosebleeds. Mouth/throat: Denies hoarseness, sore throat, bleeding gums, and difficulty swallowing. Heart: Denies chest pain, palpitations, racing heart, irregular heartbeat, leg pain or swelling, and decreased activity tolerance. Respiratory: Denies breathing difficulty, shortness of breath, wheezing, cough, and sputum. GI: Denies change in appetite, heartburn, nausea, vomiting, constipation, diarrhea, and blood in stool. GU: Denies difficulty urinating, pain with urinating, urgency, frequency, blood in urine. Musculoskeletal: Denies joint stiffness, pain, swelling, muscle weakness. Skin: Denies rash, itching, mass, tumors, sores, and boils Neurologic: Denies headache, fainting, dizziness, seizures, numbness, and tingling. Psychiatric: Denies depression, anxiety, difficulty sleeping, and memory loss. Endocrine: Denies heat or cold intolerance, and increased thirst or urination. Blood/lymph: Denies easy bruising, easy bruising, and swollen glands     Objective:     BP 131/83 (BP Location: Right Arm)   Pulse 91   Temp 98.4 F (36.9 C) (Oral)   Resp 16   Ht 6\' 1"  (1.854 m)   Wt 85.7 kg   SpO2 96%   BMI 24.93 kg/m   Constitutional :  alert, cooperative, appears stated age and no distress  Lymphatics/Throat:  no asymmetry, masses, or scars  Respiratory:  clear to auscultation bilaterally  Cardiovascular:  regular rate and rhythm  Gastrointestinal: soft, non-tender; bowel sounds normal; no masses,  no organomegaly.  Musculoskeletal: Steady gait and movement.  Left elbow  wrist hand range of motion all intact, sensory all intact as well.  Skin: Cool and moist.  Obvious increased swelling  starting from the left elbow region extending to mid humerus down all the way to his fingertips.  Minor erythema accompanying the swelling.  Area of most tenderness located at the injection site in the antecubital fossa with some darker skin discoloration, but no obvious purulent discharge at this time.  Psychiatric: Normal affect, non-agitated, not confused       LABS:  CMP Latest Ref Rng & Units 12/31/2018 12/30/2018 05/17/2018  Glucose 70 - 99 mg/dL 161(W124(H) 960(A175(H) 540(J157(H)  BUN 6 - 20 mg/dL 7 14 12   Creatinine 0.61 - 1.24 mg/dL 8.11(B0.58(L) 1.470.70 8.290.82  Sodium 135 - 145 mmol/L 134(L) 134(L) 137  Potassium 3.5 - 5.1 mmol/L 4.2 3.6 4.6  Chloride 98 - 111 mmol/L 100 96(L) 104  CO2 22 - 32 mmol/L 24 27 24   Calcium 8.9 - 10.3 mg/dL 8.1(L) 8.2(L) 8.8(L)  Total Protein 6.5 - 8.1 g/dL - 6.5 -  Total Bilirubin 0.3 - 1.2 mg/dL - 0.4 -  Alkaline Phos 38 - 126 U/L - 116 -  AST 15 - 41 U/L - 24 -  ALT 0 - 44 U/L - 55(H) -   CBC Latest Ref Rng & Units 12/31/2018 12/30/2018 05/13/2018  WBC 4.0 - 10.5 K/uL 15.5(H) 13.1(H) 11.8(H)  Hemoglobin 13.0 - 17.0 g/dL 11.7(L) 11.9(L) 12.7(L)  Hematocrit 39.0 - 52.0 % 35.4(L) 35.8(L) 38.8(L)  Platelets 150 - 400 K/uL 209 224 347    RADS: CLINICAL DATA:  Cellulitis and swelling  EXAM: ULTRASOUND left UPPER EXTREMITY LIMITED  TECHNIQUE: Ultrasound examination of the upper extremity soft tissues was performed in the area of clinical concern.  COMPARISON:  None  FINDINGS: Targeted sonography of the left upper extremity in the region of swelling performed. This is designated as the left antecubital area. Prominent echogenic edema within the soft tissues. 2.5 x 1.3 by 1.6 cm hypoechoic area deep to the superficial soft tissues.  IMPRESSION: Prominent soft tissue edema. Focal a vascular hypoechoic area in the region of swelling measuring 2.5 x 1.3 x 1.6 cm could reflect a small abscess. CT or MRI could be obtained for further evaluation.   Electronically  Signed   By: Jasmine PangKim  Fujinaga M.D.   On: 12/30/2018 21:28   Assessment:     Left antecubital fossa abscess, left arm cellulitis, IV drug abuse   Plan:     1.  Discussed incision and drainage of abscess.  Alternatives include continued observation and antibiotics although unlikely to improve current symptoms due to increasing leukocytosis and obvious abscess on imaging.Marland Kitchen.  Benefits include possible symptom relief.    Discussed the risk of surgery including recurrence, chronic pain, post-op infxn, poor cosmesis, poor/delayed wound healing, and possible re-operation to address said risks. The risks of general anesthetic, if used, includes MI, CVA, sudden death or even reaction to anesthetic medications also discussed.  Typical post-op recovery time  with possible activity restrictions, prolonged wound care were also discussed.  The patient verbalized understanding verbalized consent to proceed with I&D.  I attempted to discuss with him his IV drug use, but he was adamantly against any intervention to try to break his habit.  He stated "why do I even have to bother quitting", despite my attempts at explaining to him his current condition is directly related to his IV drug use.  Urine drug screen noted positive for methamphetamines as reported in the history, discussed  this with the anesthesiologist who are okay to proceed with surgery.  Due to his p.o. status, we will proceed with surgery 8 hours after last meal.  Symptomatic control and IV antibiotics in the meantime per primary

## 2018-12-31 NOTE — Progress Notes (Signed)
Advanced care plan. Purpose of the Encounter: CODE STATUS Parties in Attendance: Patient Patient's Decision Capacity: Good Subjective/Patient's story: Brett [name]  is a 38 y.o. male with a known history of IV drug abuse and hepatitis C.  He presented to the emergency room complaining of left upper extremity edema and pain becoming recently worse over the last 4 days however pain and edema became significantly worse over the last 24 hours.  There is an area of erythema in his left AC area however no evidence of drainage.  He has noted subjective fevers however no chills.  He denies nausea, vomiting, diarrhea.  He denies abdominal pain.  He denies chest pain or shortness of breath.  He admits to ongoing IV drug abuse with cocaine.  He also admits to marijuana and tobacco abuse. Historically patient has been treated for pseudomonal bacteremia and septic embolism.  Right upper extremity ultrasound was performed demonstrating possible small abscess measuring 2.5 x 1.3 x 1.6 cm.  WBC is 13.1 with sed rate 50.  Lactic acid is 1.7.  Blood cultures are pending.  He was started on IV vancomycin and Rocephin in the emergency room.  He received initial 1 L normal saline bolus.  Rapid COVID-19 testing is negative He has been admitted to the hospitalist service for further management. Objective/Medical story Patient needs IV antibiotics for cellulitis of the arm Needs tobacco cessation counseling COVID-19 testing Goals of care determination:  Advance care directives goals of care treatment plan discussed Patient wants everything done which includes CPR, intubation ventilator the need arises CODE STATUS: Full code Time spent discussing advanced care planning: 16 minutes

## 2018-12-31 NOTE — Anesthesia Preprocedure Evaluation (Signed)
Anesthesia Evaluation  Patient identified by MRN, date of birth, ID band Patient awake    Reviewed: Allergy & Precautions, NPO status , Patient's Chart, lab work & pertinent test results, reviewed documented beta blocker date and time   Airway Mallampati: II  TM Distance: >3 FB     Dental  (+) Chipped, Missing, Poor Dentition   Pulmonary Current Smoker,           Cardiovascular      Neuro/Psych    GI/Hepatic (+) Hepatitis -  Endo/Other    Renal/GU      Musculoskeletal   Abdominal   Peds  Hematology   Anesthesia Other Findings On methadone. Smokes. EF 55-60 1 yr ago. EKG ok 1 yr  Ago. Drug hx, cocaine negative.  Reproductive/Obstetrics                             Anesthesia Physical Anesthesia Plan  ASA: III  Anesthesia Plan: General   Post-op Pain Management:    Induction: Intravenous  PONV Risk Score and Plan:   Airway Management Planned: LMA  Additional Equipment:   Intra-op Plan:   Post-operative Plan:   Informed Consent: I have reviewed the patients History and Physical, chart, labs and discussed the procedure including the risks, benefits and alternatives for the proposed anesthesia with the patient or authorized representative who has indicated his/her understanding and acceptance.       Plan Discussed with: CRNA  Anesthesia Plan Comments:         Anesthesia Quick Evaluation

## 2018-12-31 NOTE — Progress Notes (Addendum)
[facility] - [place] [facility]   PATIENT NAME: Brett [name]    MR#:  [id number]  DATE OF BIRTH:  03/24/1981  SUBJECTIVE:  CHIEF COMPLAINT:   Chief Complaint  Patient presents with  . Abscess  Patient seen and evaluated today Swelling in the left arm and forearm Tenderness in the left arm and forearm Has redness in the left arm and forearm  REVIEW OF SYSTEMS:    ROS  CONSTITUTIONAL: No documented fever. No fatigue, weakness. No weight gain, no weight loss.  EYES: No blurry or double vision.  ENT: No tinnitus. No postnasal drip. No redness of the oropharynx.  RESPIRATORY: No cough, no wheeze, no hemoptysis. No dyspnea.  CARDIOVASCULAR: No chest pain. No orthopnea. No palpitations. No syncope.  GASTROINTESTINAL: No nausea, no vomiting or diarrhea. No abdominal pain. No melena or hematochezia.  GENITOURINARY: No dysuria or hematuria.  ENDOCRINE: No polyuria or nocturia. No heat or cold intolerance.  HEMATOLOGY: No anemia. No bruising. No bleeding.  INTEGUMENTARY: No rashes. No lesions.  MUSCULOSKELETAL: No arthritis. No swelling. No gout.  Tenderness left arm and fore arm NEUROLOGIC: No numbness, tingling, or ataxia. No seizure-type activity.  PSYCHIATRIC: No anxiety. No insomnia. No ADD.   DRUG ALLERGIES:   Allergies  Allergen Reactions  . Penicillins Shortness Of Breath, Itching and Rash    Has patient had a PCN reaction causing immediate rash, facial/tongue/throat swelling, SOB or lightheadedness with hypotension: Yes Has patient had a PCN reaction causing severe rash involving mucus membranes or skin necrosis: Unk Has patient had a PCN reaction that required hospitalization: Unk Has patient had a PCN reaction occurring within the last 10 years: No If all of the above answers are "NO", then may proceed with Cephalosporin use.     VITALS:  Blood pressure 122/69, pulse 72, temperature 98.4 F (36.9 C), temperature source Oral, resp. rate 14, height  6\' 1"  (1.854 m), weight 85.7 kg, SpO2 95 %.  PHYSICAL EXAMINATION:   Physical Exam  GENERAL:  38 y.o.-year-old patient lying in the bed with no acute distress.  EYES: Pupils equal, round, reactive to light and accommodation. No scleral icterus. Extraocular muscles intact.  HEENT: Head atraumatic, normocephalic. Oropharynx and nasopharynx clear.  NECK:  Supple, no jugular venous distention. No thyroid enlargement, no tenderness.  LUNGS: Normal breath sounds bilaterally, no wheezing, rales, rhonchi. No use of accessory muscles of respiration.  CARDIOVASCULAR: S1, S2 normal. No murmurs, rubs, or gallops.  ABDOMEN: Soft, nontender, nondistended. Bowel sounds present. No organomegaly or mass.  EXTREMITIES: No cyanosis, clubbing  Tenderness left arm and fore arm NEUROLOGIC: Cranial nerves II through XII are intact. No focal Motor or sensory deficits b/l.   PSYCHIATRIC: The patient is alert and oriented x 3.  SKIN: wound noted left cubital fossa Redness of skin left fore arm and arm with swelling     LABORATORY PANEL:   CBC Recent Labs  Lab 12/30/18 1956  WBC 13.1*  HGB 11.9*  HCT 35.8*  PLT 224   ------------------------------------------------------------------------------------------------------------------ Chemistries  Recent Labs  Lab 12/30/18 1956 12/31/18 0643  NA 134* 134*  K 3.6 4.2  CL 96* 100  CO2 27 24  GLUCOSE 175* 124*  BUN 14 7  CREATININE 0.70 0.58*  CALCIUM 8.2* 8.1*  AST 24  --   ALT 55*  --   ALKPHOS 116  --   BILITOT 0.4  --    ------------------------------------------------------------------------------------------------------------------  Cardiac Enzymes No results for input(s): TROPONINI in the last 168  hours. ------------------------------------------------------------------------------------------------------------------  RADIOLOGY:  [place] Lt Upper Extrem Ltd Soft Tissue Non Vascular  Result Date: 12/30/2018 CLINICAL DATA:  Cellulitis and  swelling EXAM: ULTRASOUND left UPPER EXTREMITY LIMITED TECHNIQUE: Ultrasound examination of the upper extremity soft tissues was performed in the area of clinical concern. COMPARISON:  None FINDINGS: Targeted sonography of the left upper extremity in the region of swelling performed. This is designated as the left antecubital area. Prominent echogenic edema within the soft tissues. 2.5 x 1.3 by 1.6 cm hypoechoic area deep to the superficial soft tissues. IMPRESSION: Prominent soft tissue edema. Focal a vascular hypoechoic area in the region of swelling measuring 2.5 x 1.3 x 1.6 cm could reflect a small abscess. CT or MRI could be obtained for further evaluation. Electronically Signed   By: Donavan Foil M.D.   On: 12/30/2018 21:28     ASSESSMENT AND PLAN:   38 year old male patient with history of drug abuse IVDA, tobacco abuse currently under hospitalist service for left upper extremity infection  -Left upper extremity cellulitis Continue IV vancomycin ,cefepime and flagyl antibiotic Follow-up cultures and WBC count Surgery consult for drainage if needed for potential abscess  -Tobacco abuse Tobacco cessation counseled to the patient for 6 minutes Nicotine patch offered  -Left upper extremity pain Pain management with oral and IV analgesics  -History of IV drug abuse Counseled patient for abstinence from drugs  All the records are reviewed and case discussed with Care Management/Social Worker. Management plans discussed with the patient, family and they are in agreement.  CODE STATUS: Full code  DVT Prophylaxis: SCDs  TOTAL TIME TAKING CARE OF THIS PATIENT: 45 minutes.   POSSIBLE D/C IN 2 to 3 DAYS, DEPENDING ON CLINICAL CONDITION.  Saundra Shelling M.D on 12/31/2018 at 9:56 AM  Between 7am to 6pm - Pager - [phone]  After 6pm go to [email or web address] - password [email or web address] [facility]  Office  [phone]  CC: Primary care physician; Patient, No Pcp Per   Note: This dictation was prepared with Dragon dictation along with smaller phrase technology. Any transcriptional errors that result from this process are unintentional.

## 2018-12-31 NOTE — Anesthesia Procedure Notes (Signed)
Procedure Name: LMA Insertion Date/Time: 12/31/2018 7:42 PM Performed by: Sherrine Maples, CRNA Pre-anesthesia Checklist: Patient identified, Patient being monitored, Timeout performed, Emergency Drugs available and Suction available Patient Re-evaluated:Patient Re-evaluated prior to induction Oxygen Delivery Method: Circle system utilized Preoxygenation: Pre-oxygenation with 100% oxygen Induction Type: IV induction Ventilation: Mask ventilation without difficulty LMA: LMA inserted LMA Size: 5.0 Tube type: Oral Number of attempts: 1 Placement Confirmation: positive ETCO2 and breath sounds checked- equal and bilateral Tube secured with: Tape Dental Injury: Teeth and Oropharynx as per pre-operative assessment

## 2018-12-31 NOTE — Transfer of Care (Signed)
Immediate Anesthesia Transfer of Care Note  Patient: Brett [name]  Procedure(s) Performed: INCISION AND DRAINAGE ABSCESS (Left )  Patient Location: PACU  Anesthesia Type:General  Level of Consciousness: sedated and drowsy  Airway & Oxygen Therapy: Patient Spontanous Breathing and Patient connected to face mask oxygen  Post-op Assessment: Report given to RN and Post -op Vital signs reviewed and stable  Post vital signs: Reviewed and stable  Last Vitals:  Vitals Value Taken Time  BP 119/81 12/31/18 2032  Temp    Pulse 96 12/31/18 2032  Resp 12 12/31/18 2032  SpO2 100 % 12/31/18 2032  Vitals shown include unvalidated device data.  Last Pain:  Vitals:   12/31/18 1200  TempSrc: Oral  PainSc:          Complications: No apparent anesthesia complications

## 2019-01-01 ENCOUNTER — Encounter: Payer: Self-pay | Admitting: Surgery

## 2019-01-01 ENCOUNTER — Inpatient Hospital Stay (HOSPITAL_COMMUNITY)
Admit: 2019-01-01 | Discharge: 2019-01-01 | Disposition: A | Discharge status: Home / Self Care | Payer: Self-pay | Attending: Internal Medicine | Admitting: Internal Medicine

## 2019-01-01 DIAGNOSIS — R7881 Bacteremia: Secondary | ICD-10-CM

## 2019-01-01 LAB — CREATININE, SERUM
Creatinine, Ser: 0.74 mg/dL (ref 0.61–1.24)
GFR calc Af Amer: >60 mL/min (ref >60)
GFR calc non Af Amer: >60 mL/min (ref >60)

## 2019-01-01 LAB — CBC
HCT: 35.1 % — ABNORMAL LOW (ref 39.0–52.0)
Hemoglobin: 11.6 g/dL — ABNORMAL LOW (ref 13.0–17.0)
MCH: 28 pg (ref 26.0–34.0)
MCHC: 33 g/dL (ref 30.0–36.0)
MCV: 84.6 fL (ref 80.0–100.0)
Platelets: 227 10*3/uL (ref 150–400)
RBC: 4.15 MIL/uL — ABNORMAL LOW (ref 4.22–5.81)
RDW: 12.6 % (ref 11.5–15.5)
WBC: 13.7 10*3/uL — ABNORMAL HIGH (ref 4.0–10.5)
nRBC: 0 % (ref 0.0–0.2)

## 2019-01-01 LAB — ECHOCARDIOGRAM COMPLETE
Height: 73 in
Weight: 3022.95 oz

## 2019-01-01 NOTE — Progress Notes (Signed)
[employer] - [place] at [facility]   PATIENT NAME: Brett [name]    MR#:  [id number]  DATE OF BIRTH:  09/14/1980  SUBJECTIVE:  CHIEF COMPLAINT:   Chief Complaint  Patient presents with  . Abscess  Patient seen and evaluated today Swelling in the left arm and forearm decreasing Tenderness in the left arm and forearm Has redness in the left arm and forearm Had incision and drainage of purulent material from the left antecubital fossa yesterday by surgical service  REVIEW OF SYSTEMS:    ROS  CONSTITUTIONAL: No documented fever. No fatigue, weakness. No weight gain, no weight loss.  EYES: No blurry or double vision.  ENT: No tinnitus. No postnasal drip. No redness of the oropharynx.  RESPIRATORY: No cough, no wheeze, no hemoptysis. No dyspnea.  CARDIOVASCULAR: No chest pain. No orthopnea. No palpitations. No syncope.  GASTROINTESTINAL: No nausea, no vomiting or diarrhea. No abdominal pain. No melena or hematochezia.  GENITOURINARY: No dysuria or hematuria.  ENDOCRINE: No polyuria or nocturia. No heat or cold intolerance.  HEMATOLOGY: No anemia. No bruising. No bleeding.  INTEGUMENTARY: No rashes. No lesions.  MUSCULOSKELETAL: No arthritis. No swelling. No gout.  Tenderness left arm and fore arm Bandage noted to the left arm and forearm Redness of skin over the left arm and forearm NEUROLOGIC: No numbness, tingling, or ataxia. No seizure-type activity.  PSYCHIATRIC: No anxiety. No insomnia. No ADD.   DRUG ALLERGIES:   Allergies  Allergen Reactions  . Penicillins Shortness Of Breath, Itching and Rash    Has patient had a PCN reaction causing immediate rash, facial/tongue/throat swelling, SOB or lightheadedness with hypotension: Yes Has patient had a PCN reaction causing severe rash involving mucus membranes or skin necrosis: Unk Has patient had a PCN reaction that required hospitalization: Unk Has patient had a PCN reaction occurring within the last 10 years:  No If all of the above answers are "NO", then may proceed with Cephalosporin use.     VITALS:  Blood pressure 109/71, pulse 86, temperature 98.4 F (36.9 C), temperature source Oral, resp. rate 18, height 6\' 1"  (1.854 m), weight 85.7 kg, SpO2 97 %.  PHYSICAL EXAMINATION:   Physical Exam  GENERAL:  38 y.o.-year-old patient lying in the bed with no acute distress.  EYES: Pupils equal, round, reactive to light and accommodation. No scleral icterus. Extraocular muscles intact.  HEENT: Head atraumatic, normocephalic. Oropharynx and nasopharynx clear.  NECK:  Supple, no jugular venous distention. No thyroid enlargement, no tenderness.  LUNGS: Normal breath sounds bilaterally, no wheezing, rales, rhonchi. No use of accessory muscles of respiration.  CARDIOVASCULAR: S1, S2 normal. No murmurs, rubs, or gallops.  ABDOMEN: Soft, nontender, nondistended. Bowel sounds present. No organomegaly or mass.  EXTREMITIES: No cyanosis, clubbing  Tenderness left arm and fore arm NEUROLOGIC: Cranial nerves II through XII are intact. No focal Motor or sensory deficits b/l.   PSYCHIATRIC: The patient is alert and oriented x 3.  SKIN: wound noted left cubital fossa Redness of skin left fore arm and arm with swelling     LABORATORY PANEL:   CBC Recent Labs  Lab 01/01/19 0338  WBC 13.7*  HGB 11.6*  HCT 35.1*  PLT 227   ------------------------------------------------------------------------------------------------------------------ Chemistries  Recent Labs  Lab 12/30/18 1956 12/31/18 0643 01/01/19 0338  NA 134* 134*  --   K 3.6 4.2  --   CL 96* 100  --   CO2 27 24  --   GLUCOSE 175* 124*  --  BUN 14 7  --   CREATININE 0.70 0.58* 0.74  CALCIUM 8.2* 8.1*  --   AST 24  --   --   ALT 55*  --   --   ALKPHOS 116  --   --   BILITOT 0.4  --   --    ------------------------------------------------------------------------------------------------------------------  Cardiac Enzymes No  results for input(s): TROPONINI in the last 168 hours. ------------------------------------------------------------------------------------------------------------------  RADIOLOGY:  [place] Lt Upper Extrem Ltd Soft Tissue Non Vascular  Result Date: 12/30/2018 CLINICAL DATA:  Cellulitis and swelling EXAM: ULTRASOUND left UPPER EXTREMITY LIMITED TECHNIQUE: Ultrasound examination of the upper extremity soft tissues was performed in the area of clinical concern. COMPARISON:  None FINDINGS: Targeted sonography of the left upper extremity in the region of swelling performed. This is designated as the left antecubital area. Prominent echogenic edema within the soft tissues. 2.5 x 1.3 by 1.6 cm hypoechoic area deep to the superficial soft tissues. IMPRESSION: Prominent soft tissue edema. Focal a vascular hypoechoic area in the region of swelling measuring 2.5 x 1.3 x 1.6 cm could reflect a small abscess. CT or MRI could be obtained for further evaluation. Electronically Signed   By: Donavan Foil M.D.   On: 12/30/2018 21:28     ASSESSMENT AND PLAN:   38 year old male patient with history of drug abuse IVDA, tobacco abuse currently under hospitalist service for left upper extremity infection  -Arm forearm abscess Status post incision and drainage of purulent material by surgical service in the OR Wound care to continue  -Left upper extremity cellulitis Continue IV vancomycin ,cefepime and flagyl antibiotic Follow-up cultures and de-escalate antibiotics follow-up and WBC count  -History of IVDA Check echocardiogram to assess for any vegetations  -Tobacco abuse Tobacco cessation counseled to the patient for 6 minutes Nicotine patch offered  -Left upper extremity pain Pain management with oral and IV analgesics  -History of IV drug abuse Counseled patient for abstinence from drugs  All the records are reviewed and case discussed with Care Management/Social Worker. Management plans discussed with  the patient, family and they are in agreement.  CODE STATUS: Full code  DVT Prophylaxis: SCDs  TOTAL TIME TAKING CARE OF THIS PATIENT: 35 minutes.   POSSIBLE D/C IN 2 to 3 DAYS, DEPENDING ON CLINICAL CONDITION.  Saundra Shelling M.D on 01/01/2019 at 10:15 AM  Between 7am to 6pm - Pager - [phone]  After 6pm go to [email or web address] - password [email or web address] [facility]  Office  [phone]  CC: Primary care physician; Patient, No Pcp Per  Note: This dictation was prepared with Dragon dictation along with smaller phrase technology. Any transcriptional errors that result from this process are unintentional.

## 2019-01-01 NOTE — Progress Notes (Signed)
*  PRELIMINARY RESULTS* Echocardiogram 2D Echocardiogram has been performed.  Brett [name] 01/01/2019, 2:29 PM

## 2019-01-01 NOTE — Progress Notes (Signed)
Subjective:  CC: Brett [name] is a 38 y.o. male  Hospital stay day 2, 1 Day Post-Op left upper extremity abscess incision and drainage  HPI: No acute events overnight.  Patient has no complaints.  ROS:  General: Denies weight loss, weight gain, fatigue, fevers, chills, and night sweats. Heart: Denies chest pain, palpitations, racing heart, irregular heartbeat, leg pain or swelling, and decreased activity tolerance. Respiratory: Denies breathing difficulty, shortness of breath, wheezing, cough, and sputum. GI: Denies change in appetite, heartburn, nausea, vomiting, constipation, diarrhea, and blood in stool. GU: Denies difficulty urinating, pain with urinating, urgency, frequency, blood in urine.   Objective:   Temp:  [98.4 F (36.9 C)-99.7 F (37.6 C)] 98.4 F (36.9 C) (07/05 0424) Pulse Rate:  [86-104] 86 (07/05 0424) Resp:  [13-24] 18 (07/05 0424) BP: (109-138)/(71-88) 109/71 (07/05 0424) SpO2:  [96 %-100 %] 97 % (07/05 0424)     Height: 6\' 1"  (185.4 cm) Weight: 85.7 kg BMI (Calculated): 24.93   Intake/Output this shift:   Intake/Output Summary (Last 24 hours) at 01/01/2019 1050 Last data filed at 01/01/2019 0900 Gross per 24 hour  Intake 2632.52 ml  Output 3160 ml  Net -527.48 ml    Constitutional :  alert, cooperative, appears stated age and no distress  Skin: Cool and moist.  Left wound dressing soaked through with serosanguineous fluid and minimal purulent drainage.  Dressing taken down completely including all packing within.  Inspection of the wound cavity notes no residual drainage at time of exam.  Swelling and erythema still present around the area but improved since last exam.  Psychiatric: Normal affect, non-agitated, not confused       LABS:  CMP Latest Ref Rng & Units 01/01/2019 12/31/2018 12/30/2018  Glucose 70 - 99 mg/dL - 124(H) 175(H)  BUN 6 - 20 mg/dL - 7 14  Creatinine 0.61 - 1.24 mg/dL 0.74 0.58(L) 0.70  Sodium 135 - 145 mmol/L - 134(L) 134(L)  Potassium 3.5  - 5.1 mmol/L - 4.2 3.6  Chloride 98 - 111 mmol/L - 100 96(L)  CO2 22 - 32 mmol/L - 24 27  Calcium 8.9 - 10.3 mg/dL - 8.1(L) 8.2(L)  Total Protein 6.5 - 8.1 g/dL - - 6.5  Total Bilirubin 0.3 - 1.2 mg/dL - - 0.4  Alkaline Phos 38 - 126 U/L - - 116  AST 15 - 41 U/L - - 24  ALT 0 - 44 U/L - - 55(H)   CBC Latest Ref Rng & Units 01/01/2019 12/31/2018 12/30/2018  WBC 4.0 - 10.5 K/uL 13.7(H) 15.5(H) 13.1(H)  Hemoglobin 13.0 - 17.0 g/dL 11.6(L) 11.7(L) 11.9(L)  Hematocrit 39.0 - 52.0 % 35.1(L) 35.4(L) 35.8(L)  Platelets 150 - 400 K/uL 227 209 224    RADS: n/a Assessment:   Status post left forearm abscess incision and drainage.  Wound still seems to be draining well.  Packed with 4 x 4, secured in place with Kerlix roll by myself.  Informed patient that we will need to continue to monitor wound closely to make sure he does not have residual infection.  Decreasing white count and overall improvement in physical exam is reassuring at this time.  I also did mention to him that if he requires additional surgical incision and drainage, will likely need to ask assistance from either orthopedics or vascular surgery, due to the close proximity of the brachial artery and elbow joint to the wound.  Patient verbalized understanding had no further questions or concerns.  IV antibiotic management as well as  monitoring withdrawal symptoms from his IV drug use per primary

## 2019-01-02 LAB — BASIC METABOLIC PANEL
Anion gap: 9 (ref 5–15)
BUN: 6 mg/dL (ref 6–20)
CO2: 24 mmol/L (ref 22–32)
Calcium: 8.1 mg/dL — ABNORMAL LOW (ref 8.9–10.3)
Chloride: 107 mmol/L (ref 98–111)
Creatinine, Ser: 0.63 mg/dL (ref 0.61–1.24)
GFR calc Af Amer: >60 mL/min (ref >60)
GFR calc non Af Amer: >60 mL/min (ref >60)
Glucose, Bld: 119 mg/dL — ABNORMAL HIGH (ref 70–99)
Potassium: 3.8 mmol/L (ref 3.5–5.1)
Sodium: 140 mmol/L (ref 135–145)

## 2019-01-02 LAB — CBC
HCT: 34.3 % — ABNORMAL LOW (ref 39.0–52.0)
Hemoglobin: 11.2 g/dL — ABNORMAL LOW (ref 13.0–17.0)
MCH: 28 pg (ref 26.0–34.0)
MCHC: 32.7 g/dL (ref 30.0–36.0)
MCV: 85.8 fL (ref 80.0–100.0)
Platelets: 218 10*3/uL (ref 150–400)
RBC: 4 MIL/uL — ABNORMAL LOW (ref 4.22–5.81)
RDW: 12.7 % (ref 11.5–15.5)
WBC: 5.7 10*3/uL (ref 4.0–10.5)
nRBC: 0 % (ref 0.0–0.2)

## 2019-01-02 MED ORDER — SULFAMETHOXAZOLE-TRIMETHOPRIM 800-160 MG PO TABS
1.0000 | ORAL_TABLET | Freq: Two times a day (BID) | ORAL | Status: DC
  Administered 2019-01-02: 1 via ORAL
  Filled 2019-01-02 (×2): qty 1

## 2019-01-02 MED ORDER — ADULT MULTIVITAMIN W/MINERALS CH: 1.0000 | ORAL_TABLET | Freq: Every day | ORAL | 0 refills | Status: DC

## 2019-01-02 MED ORDER — SULFAMETHOXAZOLE-TRIMETHOPRIM 800-160 MG PO TABS: 1.0000 | ORAL_TABLET | Freq: Two times a day (BID) | ORAL | 0 refills | Status: DC

## 2019-01-02 NOTE — TOC Initial Note (Signed)
Transition of Care [facility]) - Initial/Assessment Note    Patient Details  Name: Brett [name] MRN: [id number] Date of Birth: 01/22/1981  Transition of [facility]) CM/SW Contact:    Beverly Sessions, RN Phone Number: 01/02/2019, 5:17 PM  Clinical Narrative:                      Prior to discharge provided patient with goodrx coupon for bactrim.  Out of pocket cost $9.73   Patient Goals and CMS Choice        Expected Discharge Plan and Services           Expected Discharge Date: 01/02/19                                    Prior Living Arrangements/Services                       Activities of Daily Living Home Assistive Devices/Equipment: None ADL Screening (condition at time of admission) Patient's cognitive ability adequate to safely complete daily activities?: Yes Is the patient deaf or have difficulty hearing?: No Does the patient have difficulty seeing, even when wearing glasses/contacts?: No Does the patient have difficulty concentrating, remembering, or making decisions?: No Patient able to express need for assistance with ADLs?: Yes Does the patient have difficulty dressing or bathing?: No Independently performs ADLs?: Yes (appropriate for developmental age) Does the patient have difficulty walking or climbing stairs?: No Weakness of Legs: None Weakness of Arms/Hands: Left  Permission Sought/Granted                  Emotional Assessment              Admission diagnosis:  Cellulitis of left elbow [L03.114] Patient Active Problem List   Diagnosis Date Noted  . Cellulitis of left upper extremity 12/30/2018  . MSSA (methicillin susceptible Staphylococcus aureus) infection 05/17/2018  . Pseudomonal bacteremia 05/17/2018  . Abscess of antecubital fossa, right 05/15/2018  . Septic embolism ([place]) 05/12/2018  . Cellulitis 03/02/2018  . IV drug abuse ([place]) 03/02/2018   PCP:  Patient, No Pcp Per Pharmacy:   [facility] [place], [address] [address] Phone: [phone] Fax: [phone]     Social Determinants of Health (SDOH) Interventions    Readmission Risk Interventions No flowsheet data found.

## 2019-01-02 NOTE — Progress Notes (Signed)
Discharge instructions reviewed with the patient.  Patient sent out via wheelchair with belongings 

## 2019-01-02 NOTE — Discharge Summary (Signed)
[facility] -  at [facility]   PATIENT NAME: Brett [name]    MR#:  [id number]  DATE OF BIRTH:  07/02/1980  DATE OF ADMISSION:  12/30/2018 ADMITTING PHYSICIAN: Hannah BeatJan A Mansy, MD  DATE OF DISCHARGE: 01/02/2019  PRIMARY CARE PHYSICIAN: Patient, No Pcp Per    ADMISSION DIAGNOSIS:  Cellulitis of left elbow [L03.114]  DISCHARGE DIAGNOSIS:   Left arm Abscess s/p I and D in the setting of IV drug abuse  SECONDARY DIAGNOSIS:   Past Medical History:  Diagnosis Date  . Abscess of antecubital fossa, right 05/15/2018  . Hepatitis C     HOSPITAL COURSE:   38 year old male patient with history of drug abuse IVDA, tobacco abuse currently under hospitalist service for left upper extremity infection  - left forearm abscess Status post incision and drainage of purulent material by surgical service Dr Otho NajjarSakai--improving Wound care to be continued as out pt per instructions given by surgery  -Left upper extremity cellulitis Continue IV vancomycin ,cefepime and flagyl antibiotic--change to po bactrim for total 10 days BC neg for 3 days -WC few GPC  -History of IVDA  echocardiogram negative for any vegetations  -Tobacco abuse Tobacco cessation counseled to the patient for 6 minutes Nicotine patch offered  -History of IV drug abuse Counseled patient for abstinence from drugs--pt NOT motivated to stop using drugs. He reports he has been using it since age 89 years and will continue using it. Not interested in any out pt IV drug programs.  D/c home with out pt f/u surgery CONSULTS OBTAINED:  Treatment Team:  Sung AmabileSakai, Isami, DO  DRUG ALLERGIES:   Allergies  Allergen Reactions  . Penicillins Shortness Of Breath, Itching and Rash    Has patient had a PCN reaction causing immediate rash, facial/tongue/throat swelling, SOB or lightheadedness with hypotension: Yes Has patient had a PCN reaction causing severe rash involving mucus membranes or skin necrosis:  Unk Has patient had a PCN reaction that required hospitalization: Unk Has patient had a PCN reaction occurring within the last 10 years: No If all of the above answers are "NO", then may proceed with Cephalosporin use.     DISCHARGE MEDICATIONS:   Allergies as of 01/02/2019      Reactions   Penicillins Shortness Of Breath, Itching, Rash   Has patient had a PCN reaction causing immediate rash, facial/tongue/throat swelling, SOB or lightheadedness with hypotension: Yes Has patient had a PCN reaction causing severe rash involving mucus membranes or skin necrosis: Unk Has patient had a PCN reaction that required hospitalization: Unk Has patient had a PCN reaction occurring within the last 10 years: No If all of the above answers are "NO", then may proceed with Cephalosporin use.      Medication List    TAKE these medications   methadone 10 MG tablet Commonly known as: DOLOPHINE Take 40 mg by mouth daily.   multivitamin with minerals Tabs tablet Take 1 tablet by mouth daily. Start taking on: January 03, 2019   sulfamethoxazole-trimethoprim 800-160 MG tablet Commonly known as: BACTRIM DS Take 1 tablet by mouth every 12 (twelve) hours.       If you experience worsening of your admission symptoms, develop shortness of breath, life threatening emergency, suicidal or homicidal thoughts you must seek medical attention immediately by calling 911 or calling your MD immediately  if symptoms less severe.  You Must read complete instructions/literature along with all the possible adverse reactions/side effects for all the Medicines you take and that  have been prescribed to you. Take any new Medicines after you have completely understood and accept all the possible adverse reactions/side effects.   Please note  You were cared for by a hospitalist during your hospital stay. If you have any questions about your discharge medications or the care you received while you were in the hospital after you  are discharged, you can call the unit and asked to speak with the hospitalist on call if the hospitalist that took care of you is not available. Once you are discharged, your primary care physician will handle any further medical issues. Please note that NO REFILLS for any discharge medications will be authorized once you are discharged, as it is imperative that you return to your primary care physician (or establish a relationship with a primary care physician if you do not have one) for your aftercare needs so that they can reassess your need for medications and monitor your lab values. Today   SUBJECTIVE   I would like to go home--upon my entering pt's room  VITAL SIGNS:  Blood pressure 109/74, pulse (!) 53, temperature 98.1 F (36.7 C), temperature source Oral, resp. rate 16, height 6\' 1"  (1.854 m), weight 85.7 kg, SpO2 96 %.  I/O:    Intake/Output Summary (Last 24 hours) at 01/02/2019 1322 Last data filed at 01/02/2019 1236 Gross per 24 hour  Intake 3777.97 ml  Output 2850 ml  Net 927.97 ml    PHYSICAL EXAMINATION:  GENERAL:  38 y.o.-year-old patient lying in the bed with no acute distress.  EYES: Pupils equal, round, reactive to light and accommodation. No scleral icterus. Extraocular muscles intact.  HEENT: Head atraumatic, normocephalic. Oropharynx and nasopharynx clear.  NECK:  Supple, no jugular venous distention. No thyroid enlargement, no tenderness.  LUNGS: Normal breath sounds bilaterally, no wheezing, rales,rhonchi or crepitation. No use of accessory muscles of respiration.  CARDIOVASCULAR: S1, S2 normal. No murmurs, rubs, or gallops.  ABDOMEN: Soft, non-tender, non-distended. Bowel sounds present. No organomegaly or mass.  EXTREMITIES: No pedal edema, cyanosis, or clubbing. Left arm dressing + NEUROLOGIC: Cranial nerves II through XII are intact. Muscle strength 5/5 in all extremities. Sensation intact. Gait not checked.  PSYCHIATRIC: The patient is alert and oriented x 3.   SKIN: No obvious rash, lesion, or ulcer.   DATA REVIEW:   CBC  Recent Labs  Lab 01/02/19 0618  WBC 5.7  HGB 11.2*  HCT 34.3*  PLT 218    Chemistries  Recent Labs  Lab 12/30/18 1956  01/02/19 0618  NA 134*   < > 140  K 3.6   < > 3.8  CL 96*   < > 107  CO2 27   < > 24  GLUCOSE 175*   < > 119*  BUN 14   < > 6  CREATININE 0.70   < > 0.63  CALCIUM 8.2*   < > 8.1*  AST 24  --   --   ALT 55*  --   --   ALKPHOS 116  --   --   BILITOT 0.4  --   --    < > = values in this interval not displayed.    Microbiology Results   Recent Results (from the past 240 hour(s))  Blood culture (routine x 2)     Status: None (Preliminary result)   Collection Time: 12/30/18  7:59 PM   Specimen: BLOOD  Result Value Ref Range Status   Specimen Description BLOOD BLOOD RIGHT ARM  Final  Special Requests   Final    BOTTLES DRAWN AEROBIC AND ANAEROBIC Blood Culture adequate volume   Culture   Final    NO GROWTH 3 DAYS Performed at [facility], [address] [place], [place] [address]    Report Status PENDING  Incomplete  SARS Coronavirus 2 (CEPHEID - Performed in [facility] hospital lab), Hosp Order     Status: None   Collection Time: 12/30/18  9:24 PM   Specimen: Nasopharyngeal Swab  Result Value Ref Range Status   SARS Coronavirus 2 NEGATIVE NEGATIVE Final    Comment: (NOTE) If result is NEGATIVE SARS-CoV-2 target nucleic acids are NOT DETECTED. The SARS-CoV-2 RNA is generally detectable in upper and lower  respiratory specimens during the acute phase of infection. The lowest  concentration of SARS-CoV-2 viral copies this assay can detect is 250  copies / mL. A negative result does not preclude SARS-CoV-2 infection  and should not be used as the sole basis for treatment or other  patient management decisions.  A negative result may occur with  improper specimen collection / handling, submission of specimen other  than nasopharyngeal swab, presence of viral mutation(s)  within the  areas targeted by this assay, and inadequate number of viral copies  (<250 copies / mL). A negative result must be combined with clinical  observations, patient history, and epidemiological information. If result is POSITIVE SARS-CoV-2 target nucleic acids are DETECTED. The SARS-CoV-2 RNA is generally detectable in upper and lower  respiratory specimens dur ing the acute phase of infection.  Positive  results are indicative of active infection with SARS-CoV-2.  Clinical  correlation with patient history and other diagnostic information is  necessary to determine patient infection status.  Positive results do  not rule out bacterial infection or co-infection with other viruses. If result is PRESUMPTIVE POSTIVE SARS-CoV-2 nucleic acids MAY BE PRESENT.   A presumptive positive result was obtained on the submitted specimen  and confirmed on repeat testing.  While 2019 novel coronavirus  (SARS-CoV-2) nucleic acids may be present in the submitted sample  additional confirmatory testing may be necessary for epidemiological  and / or clinical management purposes  to differentiate between  SARS-CoV-2 and other Sarbecovirus currently known to infect humans.  If clinically indicated additional testing with an alternate test  methodology [phone]) is advised. The SARS-CoV-2 RNA is generally  detectable in upper and lower respiratory sp ecimens during the acute  phase of infection. The expected result is Negative. Fact Sheet for Patients:  BoilerBrush.com.cyhttps://www.fda.gov/media/136312/download Fact Sheet for Healthcare Providers: https://pope.com/https://www.fda.gov/media/136313/download This test is not yet approved or cleared by the [place] FDA and has been authorized for detection and/or diagnosis of SARS-CoV-2 by FDA under an Emergency Use Authorization (EUA).  This EUA will remain in effect (meaning this test can be used) for the duration of the COVID-19 declaration under Section 564(b)(1) of the Act,  21 U.S.C. section 360bbb-3(b)(1), unless the authorization is terminated or revoked sooner. Performed at [facility], [address] [place], [place] [address]   Blood culture (routine x 2)     Status: None (Preliminary result)   Collection Time: 12/31/18  6:13 AM   Specimen: BLOOD  Result Value Ref Range Status   Specimen Description BLOOD RIGHT HAND  Final   Special Requests   Final    BOTTLES DRAWN AEROBIC ONLY Blood Culture results may not be optimal due to an inadequate volume of blood received in culture bottles   Culture  Final    NO GROWTH 2 DAYS Performed at [facility], [address] [place], [place] [address]    Report Status PENDING  Incomplete  Aerobic/Anaerobic Culture (surgical/deep wound)     Status: None (Preliminary result)   Collection Time: 12/31/18  7:59 PM   Specimen: Abscess  Result Value Ref Range Status   Specimen Description   Final    ABSCESS LEFT ANTECUBITAL FOSSA Performed at [facility], [address] [place], [place] [address]    Special Requests   Final    NONE Performed at [facility], [address] [place],  [address]    Gram Stain   Final    FEW WBC PRESENT, PREDOMINANTLY PMN MODERATE GRAM POSITIVE COCCI IN PAIRS IN CHAINS    Culture   Final    CULTURE REINCUBATED FOR BETTER GROWTH Performed at [facility], [place] [address] [place],  [address]    Report Status PENDING  Incomplete    RADIOLOGY:  No results found.   CODE STATUS:     Code Status Orders  (From admission, onward)         Start     Ordered   12/30/18 2343  Full code  Continuous     12/30/18 2342        Code Status History    Date Active Date Inactive Code Status Order ID Comments User Context   05/12/2018 1427 05/17/2018 1637 Full Code [id number]  Guilford Shi, MD ED   03/02/2018 2357 03/03/2018 1528 DNR [id number]  Toy Baker, MD Inpatient   03/02/2018 2341 03/02/2018 2357 Full Code  [id number]  Toy Baker, MD Inpatient   Advance Care Planning Activity      TOTAL TIME TAKING CARE OF THIS PATIENT: 40 minutes.    Fritzi Mandes M.D on 01/02/2019 at 1:22 PM  Between 7am to 6pm - Pager - [phone] After 6pm go to [email or web address] - password [email or web address] [facility]  Office  [phone]  CC: Primary care physician; Patient, No Pcp Per

## 2019-01-02 NOTE — Discharge Instructions (Addendum)
Patient advised to continue dressing changes as per instructions given by surgery Dr. Lysle Pearl patient advised to abstain from IV drugs

## 2019-01-04 LAB — CULTURE, BLOOD (ROUTINE X 2)
Culture: NO GROWTH
Special Requests: ADEQUATE

## 2019-01-05 LAB — CULTURE, BLOOD (ROUTINE X 2): Culture: NO GROWTH

## 2019-01-06 LAB — AEROBIC/ANAEROBIC CULTURE W GRAM STAIN (SURGICAL/DEEP WOUND): Culture: NORMAL

## 2019-02-17 ENCOUNTER — Other Ambulatory Visit: Payer: Self-pay

## 2019-02-17 DIAGNOSIS — Z20822 Contact with and (suspected) exposure to covid-19: Secondary | ICD-10-CM

## 2019-02-18 LAB — NOVEL CORONAVIRUS, NAA: SARS-CoV-2, NAA: NOT DETECTED

## 2019-09-25 ENCOUNTER — Emergency Department (HOSPITAL_COMMUNITY): Payer: Self-pay

## 2019-09-25 ENCOUNTER — Encounter (HOSPITAL_COMMUNITY): Payer: Self-pay | Admitting: *Deleted

## 2019-09-25 ENCOUNTER — Emergency Department (HOSPITAL_COMMUNITY)
Admission: EM | Admit: 2019-09-25 | Discharge: 2019-09-25 | Disposition: A | Discharge status: Home / Self Care | Payer: Self-pay | Attending: Emergency Medicine | Admitting: Emergency Medicine

## 2019-09-25 ENCOUNTER — Other Ambulatory Visit: Payer: Self-pay

## 2019-09-25 DIAGNOSIS — R531 Weakness: Secondary | ICD-10-CM | POA: Insufficient documentation

## 2019-09-25 DIAGNOSIS — R748 Abnormal levels of other serum enzymes: Secondary | ICD-10-CM | POA: Insufficient documentation

## 2019-09-25 DIAGNOSIS — L03115 Cellulitis of right lower limb: Secondary | ICD-10-CM | POA: Insufficient documentation

## 2019-09-25 DIAGNOSIS — F17210 Nicotine dependence, cigarettes, uncomplicated: Secondary | ICD-10-CM | POA: Insufficient documentation

## 2019-09-25 DIAGNOSIS — F1910 Other psychoactive substance abuse, uncomplicated: Secondary | ICD-10-CM | POA: Insufficient documentation

## 2019-09-25 DIAGNOSIS — R519 Headache, unspecified: Secondary | ICD-10-CM | POA: Insufficient documentation

## 2019-09-25 DIAGNOSIS — Z79899 Other long term (current) drug therapy: Secondary | ICD-10-CM | POA: Insufficient documentation

## 2019-09-25 DIAGNOSIS — H538 Other visual disturbances: Secondary | ICD-10-CM | POA: Insufficient documentation

## 2019-09-25 DIAGNOSIS — R29898 Other symptoms and signs involving the musculoskeletal system: Secondary | ICD-10-CM

## 2019-09-25 LAB — URINALYSIS, ROUTINE W REFLEX MICROSCOPIC
Bilirubin Urine: NEGATIVE
Glucose, UA: 50 mg/dL — AB
Ketones, ur: NEGATIVE mg/dL
Leukocytes,Ua: NEGATIVE
Nitrite: NEGATIVE
Protein, ur: 30 mg/dL — AB
Specific Gravity, Urine: 1.031 — ABNORMAL HIGH (ref 1.005–1.030)
pH: 5 (ref 5.0–8.0)

## 2019-09-25 LAB — CBC WITH DIFFERENTIAL/PLATELET
Abs Immature Granulocytes: 0.04 10*3/uL (ref 0.00–0.07)
Basophils Absolute: 0 10*3/uL (ref 0.0–0.1)
Basophils Relative: 0 %
Eosinophils Absolute: 0.1 10*3/uL (ref 0.0–0.5)
Eosinophils Relative: 1 %
HCT: 43.9 % (ref 39.0–52.0)
Hemoglobin: 14.9 g/dL (ref 13.0–17.0)
Immature Granulocytes: 0 %
Lymphocytes Relative: 19 %
Lymphs Abs: 2.3 10*3/uL (ref 0.7–4.0)
MCH: 28.9 pg (ref 26.0–34.0)
MCHC: 33.9 g/dL (ref 30.0–36.0)
MCV: 85.1 fL (ref 80.0–100.0)
Monocytes Absolute: 1 10*3/uL (ref 0.1–1.0)
Monocytes Relative: 8 %
Neutro Abs: 8.7 10*3/uL — ABNORMAL HIGH (ref 1.7–7.7)
Neutrophils Relative %: 72 %
Platelets: 292 10*3/uL (ref 150–400)
RBC: 5.16 MIL/uL (ref 4.22–5.81)
RDW: 13.3 % (ref 11.5–15.5)
WBC: 12.1 10*3/uL — ABNORMAL HIGH (ref 4.0–10.5)
nRBC: 0 % (ref 0.0–0.2)

## 2019-09-25 LAB — COMPREHENSIVE METABOLIC PANEL
ALT: 79 U/L — ABNORMAL HIGH (ref 0–44)
AST: 113 U/L — ABNORMAL HIGH (ref 15–41)
Albumin: 3.6 g/dL (ref 3.5–5.0)
Alkaline Phosphatase: 95 U/L (ref 38–126)
Anion gap: 10 (ref 5–15)
BUN: 17 mg/dL (ref 6–20)
CO2: 30 mmol/L (ref 22–32)
Calcium: 9 mg/dL (ref 8.9–10.3)
Chloride: 97 mmol/L — ABNORMAL LOW (ref 98–111)
Creatinine, Ser: 0.99 mg/dL (ref 0.61–1.24)
GFR calc Af Amer: >60 mL/min (ref >60)
GFR calc non Af Amer: >60 mL/min (ref >60)
Glucose, Bld: 121 mg/dL — ABNORMAL HIGH (ref 70–99)
Potassium: 4.1 mmol/L (ref 3.5–5.1)
Sodium: 137 mmol/L (ref 135–145)
Total Bilirubin: 0.8 mg/dL (ref 0.3–1.2)
Total Protein: 6.8 g/dL (ref 6.5–8.1)

## 2019-09-25 LAB — RAPID URINE DRUG SCREEN, HOSP PERFORMED
Amphetamines: POSITIVE — AB
Barbiturates: NOT DETECTED
Benzodiazepines: NOT DETECTED
Cocaine: NOT DETECTED
Opiates: POSITIVE — AB
Tetrahydrocannabinol: NOT DETECTED

## 2019-09-25 LAB — CK: Total CK: 4948 U/L — ABNORMAL HIGH (ref 49–397)

## 2019-09-25 LAB — ETHANOL: Alcohol, Ethyl (B): <10 mg/dL (ref <10)

## 2019-09-25 MED ORDER — SODIUM CHLORIDE 0.9 % IV BOLUS: 1000.0000 mL | Freq: Once | INTRAVENOUS | Status: DC

## 2019-09-25 MED ORDER — SODIUM CHLORIDE 0.9 % IV BOLUS
1000.0000 mL | Freq: Once | INTRAVENOUS | Status: AC
  Administered 2019-09-25: 1000 mL via INTRAVENOUS

## 2019-09-25 MED ORDER — DOXYCYCLINE HYCLATE 100 MG PO CAPS: 100.0000 mg | ORAL_CAPSULE | Freq: Two times a day (BID) | ORAL | 0 refills | Status: AC

## 2019-09-25 MED ORDER — GADOBUTROL 1 MMOL/ML IV SOLN
8.0000 mL | Freq: Once | INTRAVENOUS | Status: AC | PRN
  Administered 2019-09-25: 18:00:00 8 mL via INTRAVENOUS

## 2019-09-25 MED ORDER — NICOTINE 21 MG/24HR TD PT24
21.0000 mg | MEDICATED_PATCH | Freq: Once | TRANSDERMAL | Status: DC
  Administered 2019-09-25: 21 mg via TRANSDERMAL
  Filled 2019-09-25: qty 1

## 2019-09-25 MED ORDER — SODIUM CHLORIDE 0.9 % IV BOLUS: 500.0000 mL | Freq: Once | INTRAVENOUS | Status: DC

## 2019-09-25 NOTE — Progress Notes (Signed)
Orthopedic Tech Progress Note Patient Details:  Brett [name] [birth year] [id number]  Ortho Devices Type of Ortho Device: Arm sling Ortho Device/Splint Location: LUE Ortho Device/Splint Interventions: Application   Post Interventions Patient Tolerated: Well Instructions Provided: Care of device   Brett [name] 09/25/2019, 8:20 PM

## 2019-09-25 NOTE — ED Notes (Signed)
Pt is drinking milk, delay explained to pt.  No acute distress at this time

## 2019-09-25 NOTE — ED Notes (Signed)
Pt belongings including groceries he brought were left on [facility] in ED waiting area but pt was not in waiting area.  Looked for pt including searching for him in bathrooms.  Pt found sitting outside of the ED., NT went out to check on him

## 2019-09-25 NOTE — ED Notes (Signed)
Patient transported to MRI 

## 2019-09-25 NOTE — Discharge Instructions (Addendum)
Today your MRIs of your head and your neck did not show a serious central cause for your symptoms.  I suspect that you have a nerve injury such as neuropraxia where the nerve can be stunned.  Please take your sling off, and use and move your arm as much as you can.  The sling is only for you to wear when you are moving around or walking around if needed.  If you do not take the sling off and do not stretch and move your arm it will get stiff.  You do have one blood culture that is pending.  You are given a prescription today for doxycycline to treat the cellulitis in your right lower leg.    Your creatinine, which is a muscle breakdown number was slightly elevated today.  Please drink extra water today and tomorrow to help your body flush this out and do not take any ibuprofen, Aleve, naproxen or other NSAID medications for the next 3 to 4 days.  You should be drinking enough water that you are urinating every few hours and your urine is light yellow to clear in color.

## 2019-09-25 NOTE — ED Triage Notes (Signed)
Pt reports going for a walk yesterday evening, then woke up outside this morning. Went inside and then woke up around 1030 and had left arm weakness and bilateral leg weakness, lower back pain. Reports + drug use yesterday.

## 2019-09-25 NOTE — ED Provider Notes (Signed)
[facility] EMERGENCY DEPARTMENT Provider Note   CSN: [id number] Arrival date & time: 09/25/19  1235     History Chief Complaint  Patient presents with  . Weakness  . Back Pain    Brett [name] is a 39 y.o. male with past medical history of IV drug abuse with prior septic emboli, bacteremia, and abscess, hepatitis C, who presents today for evaluation of left arm weakness. He reports that he snorted fentanyl yesterday at about 3 PM, he states he took "2 bumps."  He states that after that he was walking around outside at about 1:59 AM, and then does not remember what happened until he woke up outside of his house where he reports his trash cans usually go. He reports that his body hurts, primarily in his back.  He reports that he went inside and woke up at about 10 AM with no change in his symptoms. He reports that he has also had left upper extremity weakness.  He states that he has been in jail since the 17th of this month and has not injected since then.  He denies any fevers.  No nausea, vomiting, or diarrhea.    HPI     Past Medical History:  Diagnosis Date  . Abscess of antecubital fossa, right 05/15/2018  . Hepatitis C     Patient Active Problem List   Diagnosis Date Noted  . Cellulitis of left upper extremity 12/30/2018  . MSSA (methicillin susceptible Staphylococcus aureus) infection 05/17/2018  . Pseudomonal bacteremia 05/17/2018  . Abscess of antecubital fossa, right 05/15/2018  . Septic embolism (HCC) 05/12/2018  . Cellulitis 03/02/2018  . IV drug abuse (HCC) 03/02/2018    Past Surgical History:  Procedure Laterality Date  . I & D EXTREMITY Left 03/02/2018   Procedure: IRRIGATION AND DEBRIDEMENT EXTREMITY;  Surgeon: Bradly Bienenstock, MD;  Location: [facility] OR;  Service: Orthopedics;  Laterality: Left;  . I & D EXTREMITY Right 05/14/2018   Procedure: IRRIGATION AND DEBRIDEMENT RIGHT ANTECUBITAL ABSCESS;  Surgeon: Myrene Galas, MD;  Location: [facility] OR;   Service: Orthopedics;  Laterality: Right;  . INCISION AND DRAINAGE ABSCESS Left 12/31/2018   Procedure: INCISION AND DRAINAGE ABSCESS;  Surgeon: Sung Amabile, DO;  Location: [facility] ORS;  Service: General;  Laterality: Left;  . TEE WITHOUT CARDIOVERSION N/A 05/16/2018   Procedure: TRANSESOPHAGEAL ECHOCARDIOGRAM (TEE);  Surgeon: Quintella Reichert, MD;  Location: [facility] ENDOSCOPY;  Service: Cardiovascular;  Laterality: N/A;  . TONSILLECTOMY         History reviewed. No pertinent family history.  Social History   Tobacco Use  . Smoking status: Current Every Day Smoker    Packs/day: 1.00    Types: Cigarettes  . Smokeless tobacco: Never Used  Substance Use Topics  . Alcohol use: No  . Drug use: Yes    Types: Cocaine, Marijuana, IV    Comment: Heroin    Home Medications Prior to Admission medications   Medication Sig Start Date End Date Taking? Authorizing Provider  methadone (DOLOPHINE) 10 MG tablet Take 40 mg by mouth daily.   Yes [provider]  doxycycline (VIBRAMYCIN) 100 MG capsule Take 1 capsule (100 mg total) by mouth 2 (two) times daily for 7 days. 09/25/19 10/02/19  Cristina Gong, PA-C  Multiple Vitamin (MULTIVITAMIN WITH MINERALS) TABS tablet Take 1 tablet by mouth daily. Patient not taking: Reported on 09/25/2019 01/03/19   Enedina Finner, MD  sulfamethoxazole-trimethoprim (BACTRIM DS) 800-160 MG tablet Take 1 tablet by mouth every 12 (twelve)  hours. Patient not taking: Reported on 09/25/2019 01/02/19   Enedina FinnerPatel, Sona, MD    Allergies    Penicillins  Review of Systems   Review of Systems  Constitutional: Negative for chills and fever.  Respiratory: Negative for chest tightness and shortness of breath.   Gastrointestinal: Negative for abdominal pain, nausea and vomiting.  Genitourinary: Negative for dysuria and urgency.  Musculoskeletal: Positive for back pain.  Neurological: Positive for weakness. Negative for headaches.  All other systems reviewed and are  negative.   Physical Exam Updated Vital Signs BP 134/70 (BP Location: Right Arm)   Pulse 98   Temp 97.8 F (36.6 C) (Oral)   Resp 14   SpO2 93%   Physical Exam Vitals and nursing note reviewed.  Constitutional:      General: He is not in acute distress.    Appearance: He is well-developed. He is not diaphoretic.  HENT:     Head: Normocephalic and atraumatic.     Nose: Nose normal.     Mouth/Throat:     Mouth: Mucous membranes are moist.  Eyes:     General: No scleral icterus.       Right eye: No discharge.        Left eye: No discharge.     Conjunctiva/sclera: Conjunctivae normal.  Cardiovascular:     Rate and Rhythm: Normal rate and regular rhythm.     Pulses: Normal pulses.     Heart sounds: Normal heart sounds.  Pulmonary:     Effort: Pulmonary effort is normal. No respiratory distress.     Breath sounds: No stridor.  Abdominal:     General: There is no distension.     Tenderness: There is no abdominal tenderness. There is no guarding.  Musculoskeletal:        General: No deformity.     Cervical back: Normal range of motion and neck supple. No rigidity or tenderness.     Comments: Left arm has full passive range of motion without pain, compartments in left arm are soft and easily compressible. There is no significant swelling in bilateral legs or arms. There is diffuse bilateral thoracic and lumbar paraspinal muscle tenderness to palpation without midline pain, tenderness to palpation, step-offs, or deformities.  Skin:    General: Skin is warm and dry.     Comments: There is mild erythema on the right lower leg around the track marks.  There is minimal induration without fluctuance.  No drainage.  Neurological:     Mental Status: He is alert.     Motor: No abnormal muscle tone.     Comments: Patient has 5/5 strength bilateral lower extremities, right upper extremity.  Left upper extremity has decreased grip strength, wrist flexion/extension, pronator drift,  decreased bicep/tricep strength and shoulder strength.  Patient is able to move the arm however strength is decreased.   Pupils are equal round reactive to light, speech is not slurred.  He is awake and oriented answers questions appropriately.  Sensation is decreased subjectively to touch over left arm primarily along the posterior aspect ending at the wrist on the radial aspect.  Psychiatric:        Mood and Affect: Mood normal.        Behavior: Behavior normal.     ED Results / Procedures / Treatments   Labs (all labs ordered are listed, but only abnormal results are displayed) Labs Reviewed  COMPREHENSIVE METABOLIC PANEL - Abnormal; Notable for the following components:  Result Value   Chloride 97 (*)    Glucose, Bld 121 (*)    AST 113 (*)    ALT 79 (*)    All other components within normal limits  CBC WITH DIFFERENTIAL/PLATELET - Abnormal; Notable for the following components:   WBC 12.1 (*)    Neutro Abs 8.7 (*)    All other components within normal limits  URINALYSIS, ROUTINE W REFLEX MICROSCOPIC - Abnormal; Notable for the following components:   APPearance HAZY (*)    Specific Gravity, Urine 1.031 (*)    Glucose, UA 50 (*)    Hgb urine dipstick SMALL (*)    Protein, ur 30 (*)    Bacteria, UA RARE (*)    All other components within normal limits  RAPID URINE DRUG SCREEN, HOSP PERFORMED - Abnormal; Notable for the following components:   Opiates POSITIVE (*)    Amphetamines POSITIVE (*)    All other components within normal limits  CK - Abnormal; Notable for the following components:   Total CK 4,948 (*)    All other components within normal limits  CULTURE, BLOOD (ROUTINE X 2)  CULTURE, BLOOD (ROUTINE X 2)  ETHANOL    EKG None  Radiology CT Head Wo Contrast  Result Date: 09/25/2019 CLINICAL DATA:  Left upper extremity weakness, headache. Additional history provided: Patient reports waking this morning with left arm weakness and bilateral leg weakness,  lower back pain EXAM: CT HEAD WITHOUT CONTRAST TECHNIQUE: Contiguous axial images were obtained from the base of the skull through the vertex without intravenous contrast. COMPARISON:  No pertinent prior studies available for comparison. FINDINGS: Brain: There is no evidence of acute intracranial hemorrhage, intracranial mass, midline shift or extra-axial fluid collection.No demarcated cortical infarction. Vascular: No hyperdense vessel. Skull: Normal. Negative for fracture or focal lesion. Sinuses/Orbits: Visualized orbits demonstrate no acute abnormality. Mild ethmoid and bilateral maxillary sinus mucosal thickening at the imaged levels. No significant mastoid effusion. IMPRESSION: Unremarkable non-contrast CT appearance of the brain. No evidence of acute intracranial abnormality. Mild ethmoid and maxillary sinus mucosal thickening. Electronically Signed   By: Jackey Loge DO   On: 09/25/2019 14:45   MR Brain W and Wo Contrast  Result Date: 09/25/2019 CLINICAL DATA:  left arm weakness. Possible IV drug abuse. EXAM: MRI HEAD WITHOUT AND WITH CONTRAST MRI CERVICAL SPINE WITHOUT AND WITH CONTRAST TECHNIQUE: Multiplanar, multiecho pulse sequences of the brain and surrounding structures, and cervical spine, to include the craniocervical junction and cervicothoracic junction, were obtained without and with intravenous contrast. CONTRAST:  8mL GADAVIST GADOBUTROL 1 MMOL/ML IV SOLN COMPARISON:  None. FINDINGS: MRI HEAD FINDINGS Brain: No acute infarct, acute hemorrhage or extra-axial collection. Normal white matter signal. Normal volume of CSF spaces. No chronic microhemorrhage. Normal midline structures. There is no abnormal contrast enhancement. Vascular: Normal flow voids. Skull and upper cervical spine: Normal marrow signal. Sinuses/Orbits: Negative. Other: None. MRI CERVICAL SPINE FINDINGS Alignment: Physiologic. Vertebrae: No fracture, evidence of discitis, or bone lesion. Cord: Normal signal and morphology. No  epidural collections. Posterior Fossa, vertebral arteries, paraspinal tissues: Negative. Disc levels: C1-C2: Normal. C2-C3: Normal disc space and facets. No spinal canal or neuroforaminal stenosis. C3-C4: Normal disc space and facets. No spinal canal or neuroforaminal stenosis. C4-C5: There is mild right uncovertebral hypertrophy. Mild narrowing of the right neural foramen. No spinal canal stenosis. C5-C6: No disc herniation or stenosis. C6-C7: Normal disc space and facets. No spinal canal or neuroforaminal stenosis. C7-T1: Normal disc space and facets. No spinal canal  or neuroforaminal stenosis. IMPRESSION: 1. Normal brain. 2. No diskitis/osteomyelitis or epidural collection. 3. Mild C4-5 degenerative disc disease with mild right foraminal narrowing. Electronically Signed   By: Ulyses Jarred M.D.   On: 09/25/2019 19:02   MR CERVICAL SPINE W WO CONTRAST  Result Date: 09/25/2019 CLINICAL DATA:  left arm weakness. Possible IV drug abuse. EXAM: MRI HEAD WITHOUT AND WITH CONTRAST MRI CERVICAL SPINE WITHOUT AND WITH CONTRAST TECHNIQUE: Multiplanar, multiecho pulse sequences of the brain and surrounding structures, and cervical spine, to include the craniocervical junction and cervicothoracic junction, were obtained without and with intravenous contrast. CONTRAST:  65mL GADAVIST GADOBUTROL 1 MMOL/ML IV SOLN COMPARISON:  None. FINDINGS: MRI HEAD FINDINGS Brain: No acute infarct, acute hemorrhage or extra-axial collection. Normal white matter signal. Normal volume of CSF spaces. No chronic microhemorrhage. Normal midline structures. There is no abnormal contrast enhancement. Vascular: Normal flow voids. Skull and upper cervical spine: Normal marrow signal. Sinuses/Orbits: Negative. Other: None. MRI CERVICAL SPINE FINDINGS Alignment: Physiologic. Vertebrae: No fracture, evidence of discitis, or bone lesion. Cord: Normal signal and morphology. No epidural collections. Posterior Fossa, vertebral arteries, paraspinal  tissues: Negative. Disc levels: C1-C2: Normal. C2-C3: Normal disc space and facets. No spinal canal or neuroforaminal stenosis. C3-C4: Normal disc space and facets. No spinal canal or neuroforaminal stenosis. C4-C5: There is mild right uncovertebral hypertrophy. Mild narrowing of the right neural foramen. No spinal canal stenosis. C5-C6: No disc herniation or stenosis. C6-C7: Normal disc space and facets. No spinal canal or neuroforaminal stenosis. C7-T1: Normal disc space and facets. No spinal canal or neuroforaminal stenosis. IMPRESSION: 1. Normal brain. 2. No diskitis/osteomyelitis or epidural collection. 3. Mild C4-5 degenerative disc disease with mild right foraminal narrowing. Electronically Signed   By: Ulyses Jarred M.D.   On: 09/25/2019 19:02    Procedures Procedures (including critical care time)  Medications Ordered in ED Medications  nicotine (NICODERM CQ - dosed in mg/24 hours) patch 21 mg (21 mg Transdermal Patch Applied 09/25/19 1627)  sodium chloride 0.9 % bolus 1,000 mL (has no administration in time range)  gadobutrol (GADAVIST) 1 MMOL/ML injection 8 mL (8 mLs Intravenous Contrast Given 09/25/19 1819)  sodium chloride 0.9 % bolus 1,000 mL (0 mLs Intravenous Stopped 09/25/19 2056)    ED Course  I have reviewed the triage vital signs and the nursing notes.  Pertinent labs & imaging results that were available during my care of the patient were reviewed by me and considered in my medical decision making (see chart for details).  Clinical Course as of Sep 24 2256  Mon Sep 25, 2019  1611 I spoke with Dr. Cheral Marker of neurology who recommends MRI brain and C-spine with and without contrast.   [EH]    Clinical Course User Index [EH] Ollen Gross   MDM Rules/Calculators/A&P                     Patient is a 39 year old man with past medical history of IV drug abuse who presents today for evaluation of left arm weakness.  The last time he reports his left arm was normal  he was midnight, and then when he woke up outside early in the morning.  On exam he has weakness in his left arm along with subjective decreased sensation to light touch.  He is able to move the arm however strength is decreased.  Circulation is intact, compartments are soft and easily compressible, do not suspect compartment syndrome.  He has a history  of IV drug use and has evidence of mild cellulitis around his injection marks on his right lower leg.  He is not tachycardic or tachypneic and afebrile, does not meet Sirs/sepsis criteria.  UA consistent with dehydration with elevated specific gravity.  UDS is positive for opioids and amphetamines. CBC with mild leukocytosis of 12.1.  CMP with baseline transaminitis however is otherwise unremarkable.  CK is slightly elevated at about 5000, however given normal renal function, and potassium he was given IV fluids.  I had ordered 2 L, however patient chose to leave after getting 1 L stating he will p.o. hydrate at home. I spoke with on-call neurology Dr. Otelia Limes MRI of head and C-spine were obtained without evidence of a central cause for his symptoms.  No evidence of compartment syndrome on exam. Recommended conservative care.  I suspect he has a peripheral nerve palsy or praxia causing his Symptoms.    Recommended conservative care, sling removal when ever possible with stretching and ROM/stregnth exercises at home.    Neurology followup.  Discussed need to stop using drugs and smoking.    He is given doxycycline for his Cellulitis given need for MRSA coverage.    Return precautions were discussed with patient who states their understanding.  At the time of discharge patient denied any unaddressed complaints or concerns.  Patient is agreeable for discharge home.  Note: Portions of this report may have been transcribed using voice recognition software. Every effort was made to ensure accuracy; however, inadvertent computerized transcription errors may  be present  Final Clinical Impression(s) / ED Diagnoses Final diagnoses:  Left arm weakness  IV drug abuse (HCC)  Cellulitis of right lower extremity  Elevated CK    Rx / DC Orders ED Discharge Orders         Ordered    Ambulatory referral to Neurology    Comments: An appointment is requested in approximately: 2 week   09/25/19 2030    doxycycline (VIBRAMYCIN) 100 MG capsule  2 times daily     09/25/19 2035           Cristina Gong, Cordelia Poche 09/25/19 2304    Raeford Razor, MD 09/27/19 [id number]

## 2019-09-25 NOTE — ED Notes (Signed)
Was only able to get 1 set of cultures. Provider notified.

## 2019-09-25 NOTE — ED Provider Notes (Addendum)
C, MSE was initiated and I personally evaluated the patient and placed orders (if any) at  1:00 PM on September 25, 2019.  39 year old male who presents to the ED today complaining of gradual onset, constant, weakness to LUE that he noticed when he woke up this morning. Pt states that he was walking home last night around 2 AM. The next thing he remembers is waking up outside of his house around approximately 5-6 AM. He states that when he woke up he noticed that his lower back was hurting and that he had weakness in his LUE. Pt reports he went back to bed and woke up around 10 AM with worsening symptoms. Pt's wife brought him to the ED for eval. He reports that since waking up he has had a headache and blurry vision. He does endorse injecting fentanyl yesterday around 3 PM. Pt denies LLE weakness.   Physical Exam  Constitutional: He is oriented to person, place, and time.  Disheveled  HENT:  Head: Normocephalic and atraumatic.  Eyes: Conjunctivae are normal.  Pinpoint pupils  Cardiovascular: Normal rate and regular rhythm.  Pulmonary/Chest: Effort normal and breath sounds normal. No respiratory distress. He has no wheezes. He has no rales.  Musculoskeletal:       Back:     Comments: No C, T, or L midline spinal tenderness to palpation. + bilateral paraspinal muscular TTP. ROM intact to neck and back.   Neurological: He is alert and oriented to person, place, and time.  CN 3-12 grossly intact A&O x4 GCS 15 Able to lift LUE however seems to lose control of function of his LUE. Pt will hold up his arm and then flop it around. Grip strength 4/5 on L, 5/5 on R.  Strength and sensation equal to BLEs.  Gait nonataxic including with tandem walking Coordination with finger-to-nose on R; pt able to perform on L side however with ataxia Neg romberg, neg pronator drift   39 year old male with complaints of LUE weakness that began when he woke up around 5 AM this morning. LKN 2 AM this morning. On arrival  pt is tachycardic in the 110s. He is afebrile and nontachypneic. Pt with pinpoint pupils; admits to IV fentanyl use yesterday. Pt able to lift LUE however appears to "lose function" and will flop his arm around sporadically. Visualized pt later coloring in a coloring book with his left hand. Neuro exam very inconsistent; no stroke suspected. Pt does endorse lower back pain however it appears to be bilateral paraspinal muscles and no midline tenderness. No involvement of lower extremities. No red flag symptoms for cauda equina. No neck pain. Will obtain screening labs and CT Head. Pt to be further evaluated on acute side.   The patient appears stable so that the remainder of the MSE may be completed by another provider.       Tanda Rockers, PA-C 09/25/19 1310    Gwyneth Sprout, MD 09/25/19 1404

## 2019-09-25 NOTE — ED Notes (Signed)
Asked pt to come back in so that CT will be able to find him when they come for his CT scan.  Pt ambulated back in without any difficulty

## 2019-10-02 LAB — CULTURE, BLOOD (ROUTINE X 2): Culture: NO GROWTH

## 2021-03-04 ENCOUNTER — Ambulatory Visit (HOSPITAL_COMMUNITY)
Admission: EM | Admit: 2021-03-04 | Discharge: 2021-03-04 | Disposition: A | Discharge status: Home / Self Care | Payer: Medicaid Other | Attending: Psychiatry | Admitting: Psychiatry

## 2021-03-04 ENCOUNTER — Other Ambulatory Visit: Payer: Self-pay

## 2021-03-04 DIAGNOSIS — F411 Generalized anxiety disorder: Secondary | ICD-10-CM | POA: Insufficient documentation

## 2021-03-04 DIAGNOSIS — Z635 Disruption of family by separation and divorce: Secondary | ICD-10-CM | POA: Insufficient documentation

## 2021-03-04 DIAGNOSIS — F319 Bipolar disorder, unspecified: Secondary | ICD-10-CM | POA: Insufficient documentation

## 2021-03-04 MED ORDER — HYDROXYZINE PAMOATE 25 MG PO CAPS: 25.0000 mg | ORAL_CAPSULE | Freq: Three times a day (TID) | ORAL | 0 refills | Status: AC | PRN

## 2021-03-04 NOTE — ED Provider Notes (Signed)
Behavioral Health Urgent Care Medical Screening Exam  Patient Name: Brett [name] MRN: [id number] Date of Evaluation: 03/04/21 Chief Complaint:   Diagnosis:  Final diagnoses:  Generalized anxiety disorder    History of Present illness: Brett [name] is a 40 y.o. male patient presented to [facility] as a walk in accompanied by his spouse (they are separated)  with complaints of "last night I was moving and talking fast, I don't know what is going on". Reports he has a history of Bipolar disorder, anxiety and substance abuse. States he is not sure what we can do to help him, states his spouse is more concerned than he is.   Brett [name], 40 y.o., male patient seen face to face by this provider, consulted with Dr. Bronwen Betters; and chart reviewed on 03/04/21.  On evaluation Brett [name] reports he completed rehabilitation in [place] in 09/2020.  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 Last use was 2-3 weeks ago. States he is on probation and now resides in [place]. He currently does not take any medications.   During evaluation Brett [name] is in sitting position in no acute distress. He is fairly groomed and makes good eye contact. His speech is clear, coherent, normal rate and tone.  He is alert/oriented x 4 and cooperative. Denies depression but endorses anxiety. Mood is congruent. Patient is tapping his feet on the ground. Reports 4 hours of sleep per night and no appetite changes. His thought process is coherent and relevant; There is no indication that he is currently responding to internal/external stimuli or experiencing delusional thought content. He denies auditory and visual hallucinations.  He denies suicidal/self-harm/homicidal ideation.  Patient answered questions appropriately.  Patient contracts for safety. Denies access to firearm/weapon. Discussed hydroxyzine for anxiety.   Provided resources for out patient services including BH out patient services on the 2 cd floor.  Discussed open access walk in hours for medication management.  At this time Brett [name] is educated and verbalizes understanding of mental health resources and other crisis services in the community. He is instructed to call 911 and present to the nearest emergency room should he experience any suicidal/homicidal ideation, auditory/visual/hallucinations, or detrimental worsening of his mental health condition.    Collateral:  Surveyor, quantity separated spouse. Reports she thinks patient has fried his brain with drugs such as meth. States he needs medications. She does not have any safety concerns with patient returning to her home. Explained out patient resources. She states she will drop him off at 7 am in the morning for 2cd floor out patient services for open access walk in hours.     Psychiatric Specialty Exam  Presentation  General Appearance:Casual  Eye Contact:Good  Speech:Clear and Coherent; Normal Rate  Speech Volume:Normal  Handedness:Right   Mood and Affect  Mood:Anxious  Affect:Congruent   Thought Process  Thought Processes:Coherent  Descriptions of Associations:Intact  Orientation:Full (Time, Place and Person)  Thought Content:Logical    Hallucinations:None  Ideas of Reference:None  Suicidal Thoughts:No data recorded Homicidal Thoughts:No   Sensorium  Memory:Immediate Good; Recent Good; Remote Good  Judgment:Fair  Insight:Fair   Executive Functions  Concentration:Good  Attention Span:Good  Recall:Good  Fund of Knowledge:Good  Language:Good   Psychomotor Activity  Psychomotor Activity:Normal   Assets  Assets:Communication Skills; Desire for Improvement; Financial Resources/Insurance; Housing; Transportation   Sleep  Sleep:Poor  Number of hours: 4   No data recorded  Physical Exam: Physical Exam Vitals and nursing note reviewed.  Constitutional:  Appearance: He is well-developed.  HENT:     Head: Normocephalic and  atraumatic.  Eyes:     General:        Right eye: No discharge.        Left eye: No discharge.     Conjunctiva/sclera: Conjunctivae normal.  Cardiovascular:     Rate and Rhythm: Normal rate and regular rhythm.     Heart sounds: No murmur heard. Pulmonary:     Effort: Pulmonary effort is normal. No respiratory distress.     Breath sounds: Normal breath sounds.  Musculoskeletal:        General: Normal range of motion.     Cervical back: Neck supple.  Skin:    Coloration: Skin is not jaundiced or pale.  Neurological:     Mental Status: He is alert and oriented to person, place, and time.  Psychiatric:        Attention and Perception: Attention and perception normal.        Mood and Affect: Mood is anxious.        Speech: Speech normal.        Behavior: Behavior normal. Behavior is cooperative.        Thought Content: Thought content normal.        Cognition and Memory: Cognition normal.        Judgment: Judgment is impulsive.   Review of Systems  Constitutional: Negative.   HENT: Negative.  Negative for hearing loss.   Eyes: Negative.   Respiratory: Negative.  Negative for cough.   Cardiovascular: Negative.  Negative for chest pain.  Musculoskeletal: Negative.   Skin: Negative.   Neurological: Negative.   Psychiatric/Behavioral:  The patient is nervous/anxious.   Blood pressure 131/82, pulse 93, temperature 98 F (36.7 C), temperature source Oral, resp. rate 18, SpO2 95 %. There is no height or weight on file to calculate BMI.  Musculoskeletal: Strength & Muscle Tone: within normal limits Gait & Station: normal Patient leans: N/A   [facility] MSE Discharge Disposition for Follow up and Recommendations: Based on my evaluation the patient does not appear to have an emergency medical condition and can be discharged with resources and follow up care in outpatient services for Medication Management  Out patient psychiatric out patient resources provided for 2cd floor out patient  services including open access walk in hours.  Hydroxyzine 25 mg PO TID PRN printed prescription provided for anxiety.  No evidence of imminent risk to self or others at present.    Patient does not meet criteria for psychiatric inpatient admission. Discussed crisis plan, support from social network, calling 911, coming to the Emergency Department, and calling Suicide Hotline.    Ardis Hughs, NP 03/04/2021, 12:27 PM
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
## 2021-03-04 NOTE — Discharge Instructions (Addendum)

## 2021-03-26 ENCOUNTER — Other Ambulatory Visit: Payer: Self-pay

## 2021-03-26 ENCOUNTER — Encounter (HOSPITAL_COMMUNITY): Payer: Self-pay | Admitting: Physician Assistant

## 2021-03-26 ENCOUNTER — Ambulatory Visit (INDEPENDENT_AMBULATORY_CARE_PROVIDER_SITE_OTHER): Payer: No Payment, Other | Admitting: Physician Assistant

## 2021-03-26 VITALS — BP 145/79 | HR 84 | Ht 73.0 in | Wt 208.0 lb

## 2021-03-26 DIAGNOSIS — F3160 Bipolar disorder, current episode mixed, unspecified: Secondary | ICD-10-CM | POA: Diagnosis not present

## 2021-03-26 DIAGNOSIS — F1994 Other psychoactive substance use, unspecified with psychoactive substance-induced mood disorder: Secondary | ICD-10-CM | POA: Diagnosis not present

## 2021-03-26 DIAGNOSIS — F5105 Insomnia due to other mental disorder: Secondary | ICD-10-CM

## 2021-03-26 DIAGNOSIS — F411 Generalized anxiety disorder: Secondary | ICD-10-CM

## 2021-03-26 DIAGNOSIS — G4700 Insomnia, unspecified: Secondary | ICD-10-CM | POA: Insufficient documentation

## 2021-03-26 DIAGNOSIS — F99 Mental disorder, not otherwise specified: Secondary | ICD-10-CM

## 2021-03-26 MED ORDER — BUSPIRONE HCL 7.5 MG PO TABS
7.5000 mg | ORAL_TABLET | Freq: Two times a day (BID) | ORAL | 1 refills | Status: AC
  Filled 2021-03-26: qty 60, 30d supply, fill #0

## 2021-03-26 MED ORDER — OLANZAPINE 10 MG PO TABS
10.0000 mg | ORAL_TABLET | Freq: Every day | ORAL | 1 refills | Status: AC
  Filled 2021-03-26: qty 30, 30d supply, fill #0
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
## 2021-03-27 ENCOUNTER — Other Ambulatory Visit: Payer: Self-pay

## 2021-06-03 ENCOUNTER — Encounter (HOSPITAL_COMMUNITY): Payer: Medicaid Other | Admitting: Physician Assistant

## 2022-03-07 IMAGING — MR MR CERVICAL SPINE WO/W CM
8 of 20 series · 19 of 48 positions shown · IV contrast (Yes   MULTIHANCE)
Comparison: None.

CLINICAL DATA: left arm weakness. Possible IV drug abuse.

EXAM:
MRI HEAD WITHOUT AND WITH CONTRAST
MRI CERVICAL SPINE WITHOUT AND WITH CONTRAST
TECHNIQUE: Multiplanar, multiecho pulse sequences of the brain and surrounding
structures, and cervical spine, to include the craniocervical
junction and cervicothoracic junction, were obtained without and
with intravenous contrast.
CONTRAST:  8mL GADAVIST GADOBUTROL 1 MMOL/ML IV SOLN

[Series 5: T1 · sagittal · 5.0mm · 0.47mm/px · 2 of 23 slices shown (1 of 4)]
[im 1/23]
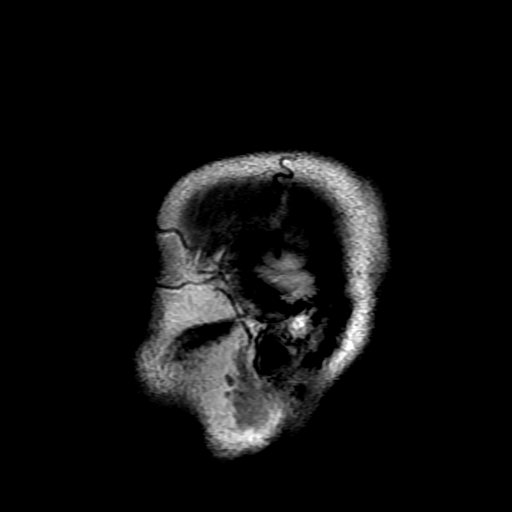
[im 23/23]
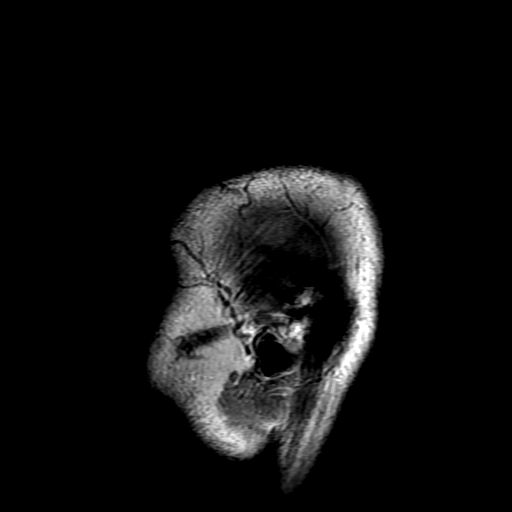

[Series 6: T2 · axial · 5.0mm · 0.43mm/px · z∈[-64,+72]mm · 2 of 24 slices shown (1 of 3)]
[im 1/24]
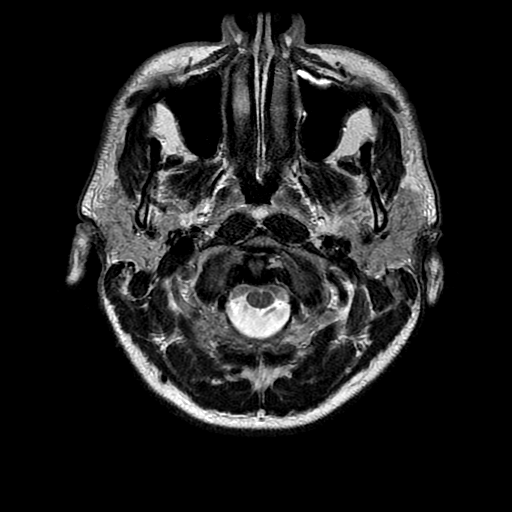
[im 24/24]
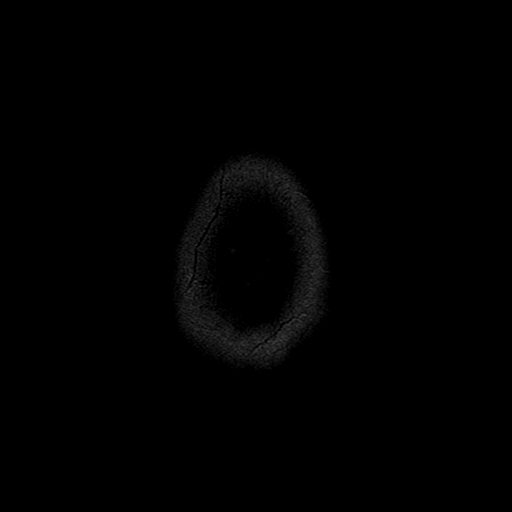

[Series 10: T1 · axial · 3.0mm · 0.47mm/px · z∈[-71,+76]mm · 7 of 100 slices shown (2 of 4)]
[im 1/100]
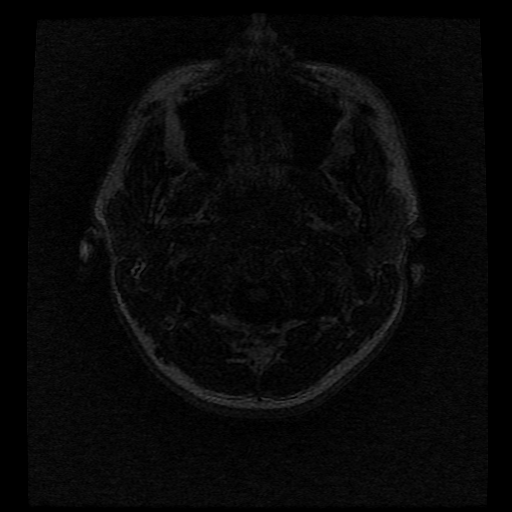
[im 17/100]
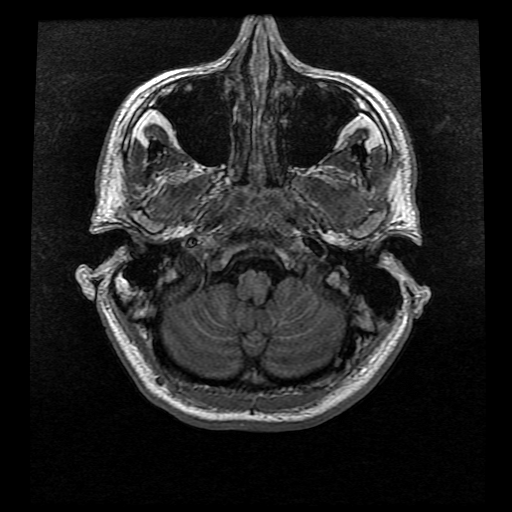
[im 34/100]
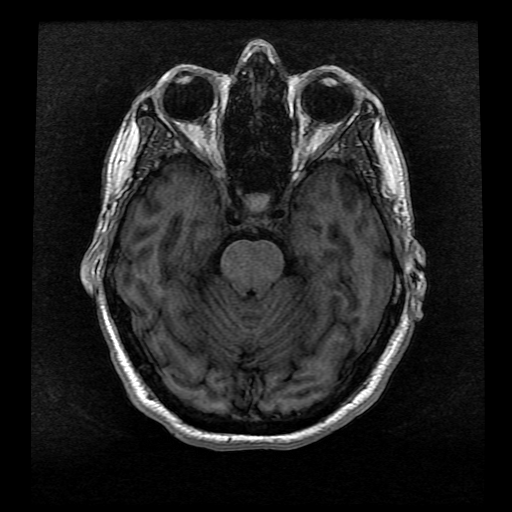
[im 50/100]
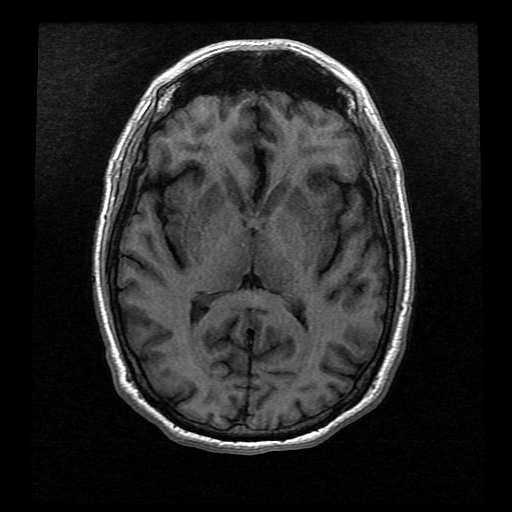
[im 67/100]
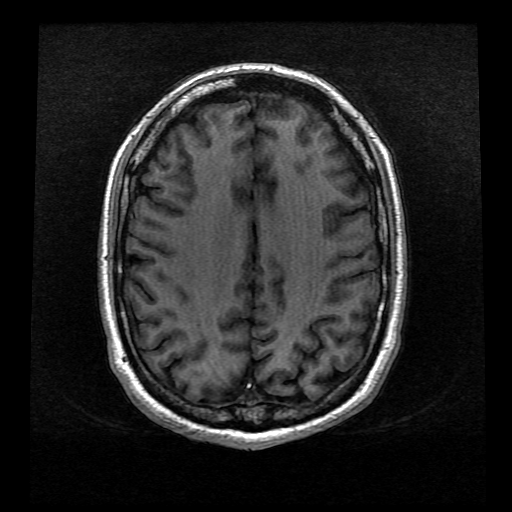
[im 83/100]
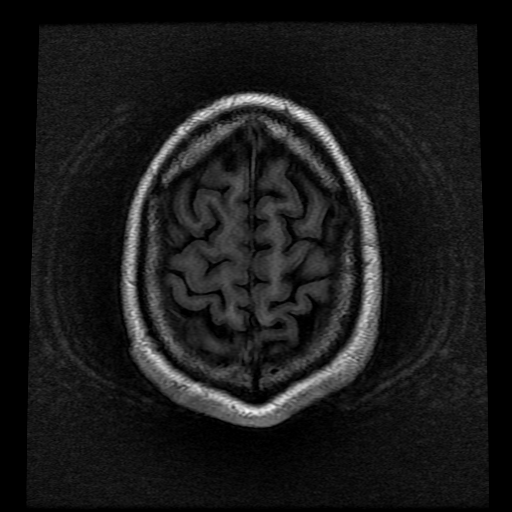
[im 100/100]
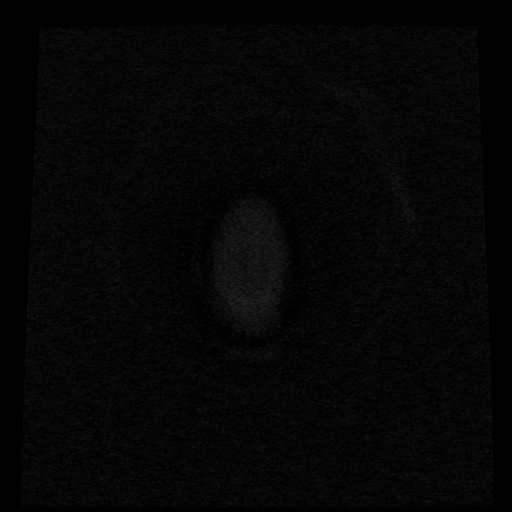

[Series 11: T2 · sagittal · 3.0mm · 0.43mm/px · 1 of 12 slices shown (2 of 3)]
[im 1/12]
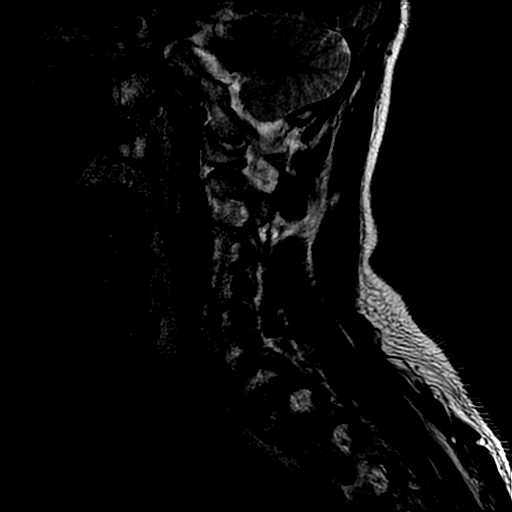

[Series 12: T1 · sagittal · 3.0mm · 0.43mm/px · 1 of 12 slices shown (3 of 4)]
[im 1/12]
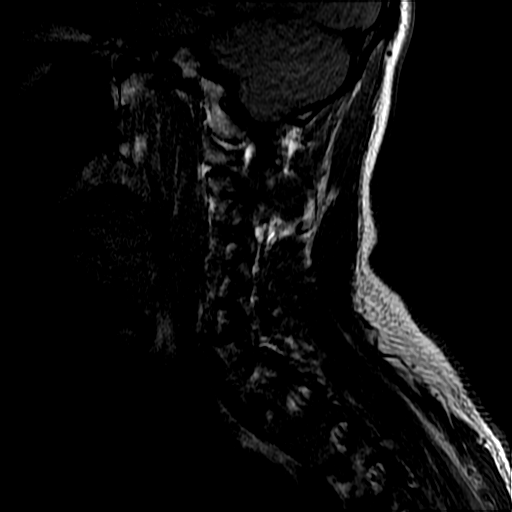

[Series 15: T1 · axial · non-contrast · 3.0mm · 0.39mm/px · z∈[-189,-92]mm · 2 of 26 slices shown (4 of 4)]
[im 1/26]
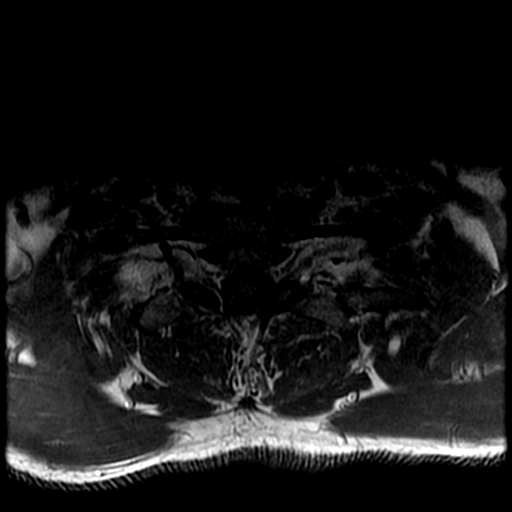
[im 26/26]
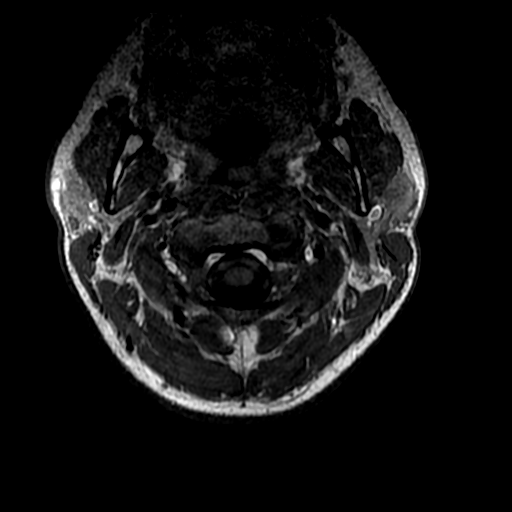

[Series 16: T2 · axial · 3.0mm · 0.39mm/px · z∈[-190,-91]mm · 2 of 30 slices shown (3 of 3)]
[im 1/30]
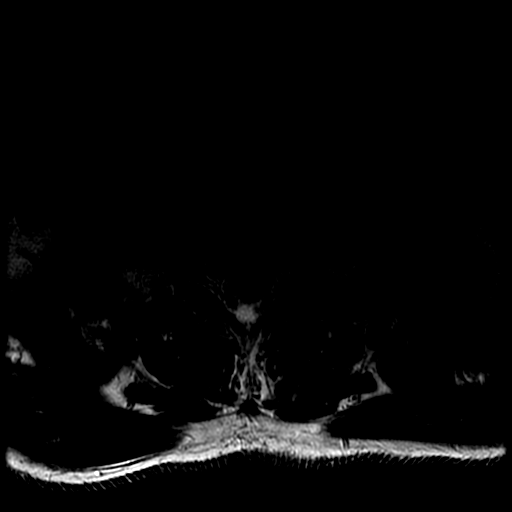
[im 30/30]
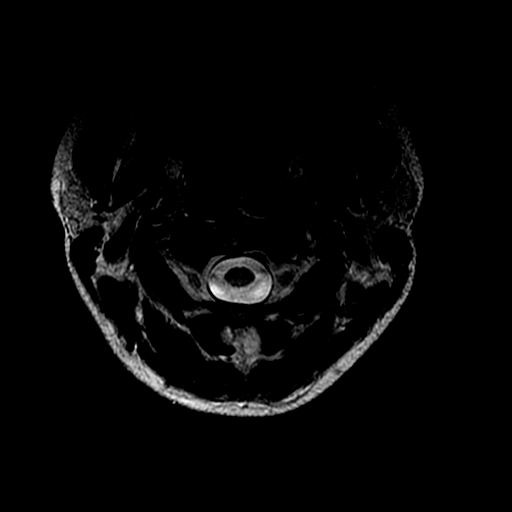

[Series 19: T2 post-contrast · coronal · 5.0mm · 0.39mm/px · 2 of 27 slices shown]
[im 1/27]
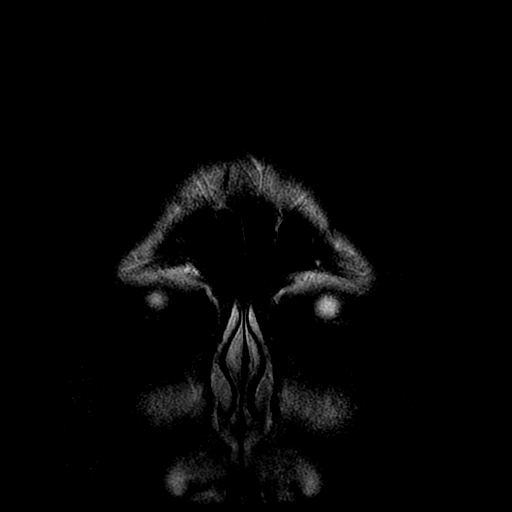
[im 27/27]
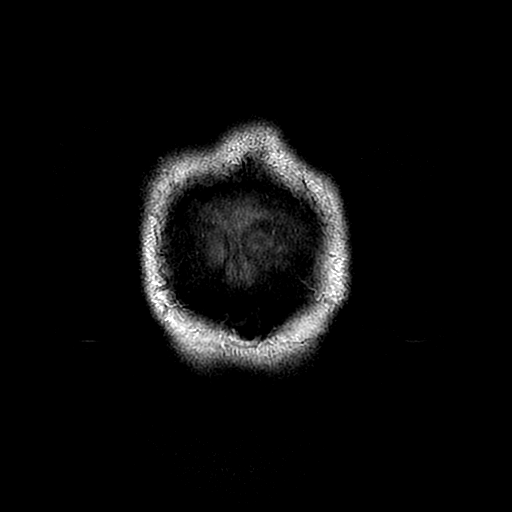

[19 of 48 positions shown; findings below may reference images not displayed]

FINDINGS: MRI HEAD FINDINGS

Brain: No acute infarct, acute hemorrhage or extra-axial collection.
Normal white matter signal. Normal volume of CSF spaces. No chronic
microhemorrhage. Normal midline structures. There is no abnormal
contrast enhancement.

Vascular: Normal flow voids.

Skull and upper cervical spine: Normal marrow signal.

Sinuses/Orbits: Negative.

Other: None.

MRI CERVICAL SPINE FINDINGS

Alignment: Physiologic.

Vertebrae: No fracture, evidence of discitis, or bone lesion.

Cord: Normal signal and morphology. No epidural collections.

Posterior Fossa, vertebral arteries, paraspinal tissues: Negative.

Disc levels:

C1-C2: Normal.

C2-C3: Normal disc space and facets. No spinal canal or
neuroforaminal stenosis.

C3-C4: Normal disc space and facets. No spinal canal or
neuroforaminal stenosis.

C4-C5: There is mild right uncovertebral hypertrophy. Mild narrowing
of the right neural foramen. No spinal canal stenosis.

C5-C6: No disc herniation or stenosis.

C6-C7: Normal disc space and facets. No spinal canal or
neuroforaminal stenosis.

C7-T1: Normal disc space and facets. No spinal canal or
neuroforaminal stenosis.
IMPRESSION: 1. Normal brain.
2. No diskitis/osteomyelitis or epidural collection.
3. Mild C4-5 degenerative disc disease with mild right foraminal
narrowing.
